# Patient Record
Sex: Female | Born: 1995 | Hispanic: Yes | Marital: Single | Smoking: Former smoker
Health system: Southern US, Community
[De-identification: ages and names within clinical notes are randomized; demographics above are authoritative.]

---
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
## 2016-06-05 ENCOUNTER — Emergency Department
Admission: EM | Admit: 2016-06-05 | Discharge: 2016-06-05 | Disposition: A | Discharge status: Home / Self Care | Payer: Medicaid Other | Attending: Emergency Medicine | Admitting: Emergency Medicine

## 2016-06-05 DIAGNOSIS — L739 Follicular disorder, unspecified: Secondary | ICD-10-CM | POA: Diagnosis not present

## 2016-06-05 DIAGNOSIS — L0231 Cutaneous abscess of buttock: Secondary | ICD-10-CM | POA: Diagnosis present

## 2016-06-05 DIAGNOSIS — L03317 Cellulitis of buttock: Secondary | ICD-10-CM

## 2016-06-05 MED ORDER — CLINDAMYCIN HCL 300 MG PO CAPS: 300.0000 mg | ORAL_CAPSULE | Freq: Three times a day (TID) | ORAL | 0 refills | Status: AC

## 2016-06-05 MED ORDER — LIDOCAINE HCL (PF) 1 % IJ SOLN
5.0000 mL | Freq: Once | INTRAMUSCULAR | Status: DC
  Filled 2016-06-05: qty 5

## 2016-06-05 NOTE — ED Triage Notes (Signed)
Pt c/o "bug bite" to left buttocks x3-4 days with increasing pain and erythema. Some purulent drainage.

## 2016-06-05 NOTE — ED Provider Notes (Signed)
[facility] Emergency Department Provider Note   ____________________________________________   I have reviewed the triage vital signs and the nursing notes.   HISTORY  Chief Complaint Abscess    HPI Sheri Brooks is a 21 y.o. female presents with abscess-like wound along the left buttock with redness and induration. Patient reports noticing it 3-4 days ago and attempting to express drainage from it on one occasion. She describes drainage is purulent. Patient thought it was a bug bite initially. Patient denies any past history of similar wound. Patient denies fever, chills, headache, vision changes, chest pain, chest tightness, shortness of breath, abdominal pain, nausea and vomiting.  No past medical history on file.  There are no active problems to display for this patient.   No past surgical history on file.  Prior to Admission medications   Medication Sig Start Date End Date Taking? Authorizing Provider  clindamycin (CLEOCIN) 300 MG capsule Take 1 capsule (300 mg total) by mouth 3 (three) times daily. 06/05/16 06/15/16  Brittny Spangle M, PA-C  phenazopyridine (PYRIDIUM) 100 MG tablet Take 1 tablet (100 mg total) by mouth 3 (three) times daily as needed for pain (May take 1-2 as needed three times daily). 06/09/15   Hagler, Jami L, PA-C  sulfamethoxazole-trimethoprim (BACTRIM DS,SEPTRA DS) 800-160 MG tablet Take 1 tablet by mouth 2 (two) times daily. 06/09/15   Hagler, Jami L, PA-C    Allergies Patient has no known allergies.  No family history on file.  Social History Social History  Substance Use Topics  . Smoking status: Never Smoker  . Smokeless tobacco: Never Used  . Alcohol use No    Review of Systems Constitutional: Negative for fever/chills Eyes: No visual changes. ENT:  Negative for sore throat and for difficulty swallowing Cardiovascular: Denies chest pain. Respiratory: Denies cough Denies shortness of breath. Musculoskeletal:  Negative for back pain. Negative for generalized body aches. Skin: Negative or rash. Abscess left buttock with surrounding erythema, non-draining.  Neurological: Negative for headaches.  Negative focal weakness or numbness.  ____________________________________________   PHYSICAL EXAM:  VITAL SIGNS: ED Triage Vitals  Enc Vitals Group     BP 06/05/16 1051 107/69     Pulse Rate 06/05/16 1051 83     Resp 06/05/16 1051 18     Temp 06/05/16 1051 98.3 F (36.8 C)     Temp Source 06/05/16 1051 Oral     SpO2 06/05/16 1051 99 %     Weight 06/05/16 1051 150 lb (68 kg)     Height 06/05/16 1051 5' (1.524 m)     Head Circumference --      Peak Flow --      Pain Score 06/05/16 1053 5     Pain Loc --      Pain Edu? --      Excl. in GC? --     Constitutional: Alert and oriented. Well appearing and in no acute distress.  Head: Normocephalic and atraumatic. Eyes: Conjunctivae are normal.  Cardiovascular: Normal rate, regular rhythm. Normal distal pulses. Respiratory: Normal respiratory effort.  Musculoskeletal: Nontender with normal range of motion in all extremities. Neurologic: Normal speech and language.  Skin:  Skin is warm, dry and intact. No rash noted. Left buttock abscess with induration and erythema encircling ~6" in diameter. Very warm to touch.  Psychiatric: Mood and affect are normal.  ____________________________________________   LABS (all labs ordered are listed, but only abnormal results are displayed)  Labs Reviewed - No data to display  ____________________________________________  EKG none ____________________________________________  RADIOLOGY none ____________________________________________   PROCEDURES  Procedure(s) performed:  INCISION AND DRAINAGE Performed by: Ramello Cordial M Victorious Cosio Consent: Verbal consent Clois Comberobtained. Risks and benefits: risks, benefits and alternatives were discussed Type: abscess  Body area: left buttock  Anesthesia: local  infiltration  Incision was made with a scalpel.  Local anesthetic: lidocaine 1%   Anesthetic total: 4.0 ml  Complexity: complex Blunt dissection to break up loculations  Drainage: purulent  Drainage amount: 5 ml  Packing material: 1/4 in iodoform gauze  Patient tolerance: Patient tolerated the procedure well with no immediate complications.   Critical Care performed: no ____________________________________________   INITIAL IMPRESSION / ASSESSMENT AND PLAN / ED COURSE  Pertinent labs & imaging results that were available during my care of the patient were reviewed by me and considered in my medical decision making (see chart for details).  Patient presents with left buttock abscess with induration and erythema surrounding approximate 6 inches in diameter. Patient tolerated I&D without complications. Patient will be prescribed clindamycin for antibiotic coverage and instructed to return in 2 days for wound check. Patient informed of clinical course, understand medical decision-making process, and agree with plan.       ____________________________________________   FINAL CLINICAL IMPRESSION(S) / ED DIAGNOSES  Final diagnoses:  Cellulitis of buttock  Folliculitis       NEW MEDICATIONS STARTED DURING THIS VISIT:  Discharge Medication List as of 06/05/2016  1:42 PM    START taking these medications   Details  clindamycin (CLEOCIN) 300 MG capsule Take 1 capsule (300 mg total) by mouth 3 (three) times daily., Starting Sun 06/05/2016, Until Wed 06/15/2016, Print         Note:  This document was prepared using Dragon voice recognition software and may include unintentional dictation errors.   Clois ComberLittle, Binh Doten M, PA-C 06/05/16 1726    Rockne MenghiniNorman, Anne-Caroline, MD 06/05/16 [phone]

## 2016-06-05 NOTE — ED Notes (Addendum)
Patient presents with wound site on left superior medial buttock. Reddened area is approx 6" in circumference around wound. Patient states that it was draining a thick brown fluid earlier in the shower. Site noted to be hot to the touch

## 2016-06-05 NOTE — Discharge Instructions (Signed)
Return for wound recheck to primary care provider or the emergency department in 2 days.

## 2016-06-07 ENCOUNTER — Emergency Department
Admission: EM | Admit: 2016-06-07 | Discharge: 2016-06-07 | Disposition: A | Discharge status: Home / Self Care | Payer: Medicaid Other | Attending: Emergency Medicine | Admitting: Emergency Medicine

## 2016-06-07 DIAGNOSIS — L0231 Cutaneous abscess of buttock: Secondary | ICD-10-CM | POA: Insufficient documentation

## 2016-06-07 DIAGNOSIS — Z5189 Encounter for other specified aftercare: Secondary | ICD-10-CM

## 2016-06-07 DIAGNOSIS — Z9114 Patient's other noncompliance with medication regimen: Secondary | ICD-10-CM | POA: Diagnosis not present

## 2016-06-07 DIAGNOSIS — Z48817 Encounter for surgical aftercare following surgery on the skin and subcutaneous tissue: Secondary | ICD-10-CM | POA: Insufficient documentation

## 2016-06-07 MED ORDER — SULFAMETHOXAZOLE-TRIMETHOPRIM 800-160 MG PO TABS: 1.0000 | ORAL_TABLET | Freq: Two times a day (BID) | ORAL | 0 refills | Status: AC

## 2016-06-07 NOTE — ED Notes (Signed)

## 2016-06-07 NOTE — ED Triage Notes (Signed)
Pt was seen here 2 days ago and had abscess drained and packed was told to come back today for follow up. Pt did not get antibiotics filled, states they were too expensive.

## 2016-06-07 NOTE — ED Provider Notes (Signed)
[facility] Emergency Department Provider Note  ____________________________________________  Time seen: Approximately 9:54 PM  I have reviewed the triage vital signs and the nursing notes.   HISTORY  Chief Complaint Wound Check    HPI Manasa Spease is a 21 y.o. female presenting to the emergency department for a wound check. Patient was seen on 06/05/2016 and underwent incision and drainage for an abscess of the left buttocks. Patient states that she has not changed her dressing since she underwent incision and drainage. Patient also did not fill her prescription because she states that it was too expensive at $30. Patient states that her pain has improved but abscess continues to drain. No other alleviating measures have been attempted.   No past medical history on file.  There are no active problems to display for this patient.   No past surgical history on file.  Prior to Admission medications   Medication Sig Start Date End Date Taking? Authorizing Provider  clindamycin (CLEOCIN) 300 MG capsule Take 1 capsule (300 mg total) by mouth 3 (three) times daily. 06/05/16 06/15/16  Little, Traci M, PA-C  phenazopyridine (PYRIDIUM) 100 MG tablet Take 1 tablet (100 mg total) by mouth 3 (three) times daily as needed for pain (May take 1-2 as needed three times daily). 06/09/15   Hagler, Jami L, PA-C  sulfamethoxazole-trimethoprim (BACTRIM DS) 800-160 MG tablet Take 1 tablet by mouth 2 (two) times daily. 06/07/16 06/17/16  Orvil Feil, PA-C    Allergies Patient has no known allergies.  No family history on file.  Social History Social History  Substance Use Topics  . Smoking status: Never Smoker  . Smokeless tobacco: Never Used  . Alcohol use No     Review of Systems  Constitutional: No fever/chills Eyes: No visual changes. No discharge ENT: No upper respiratory complaints. Cardiovascular: no chest pain. Respiratory: no cough. No  SOB. Gastrointestinal: No abdominal pain.  No nausea, no vomiting.  No diarrhea.  No constipation. Musculoskeletal: Negative for musculoskeletal pain. Skin: Patient has residual cellulitis and draining abscess of left buttocks. Neurological: Negative for headaches, focal weakness or numbness.   ____________________________________________   PHYSICAL EXAM:  VITAL SIGNS: ED Triage Vitals  Enc Vitals Group     BP 06/07/16 1959 107/71     Pulse Rate 06/07/16 1959 90     Resp 06/07/16 1959 20     Temp 06/07/16 1959 98.2 F (36.8 C)     Temp Source 06/07/16 1959 Oral     SpO2 06/07/16 1959 100 %     Weight 06/07/16 2000 150 lb (68 kg)     Height 06/07/16 2000 5' (1.524 m)     Head Circumference --      Peak Flow --      Pain Score 06/07/16 1959 6     Pain Loc --      Pain Edu? --      Excl. in GC? --      Constitutional: Alert and oriented. Well appearing and in no acute distress. Eyes: Conjunctivae are normal. PERRL. EOMI. Head: Atraumatic. Cardiovascular: Normal rate, regular rhythm. Normal S1 and S2.  Good peripheral circulation. Respiratory: Normal respiratory effort without tachypnea or retractions. Lungs CTAB. Good air entry to the bases with no decreased or absent breath sounds. Neurologic:  Normal speech and language. No gross focal neurologic deficits are appreciated.  Skin: Patient has residual cellulitis of the left buttocks. Patient has left buttocks abscess that continues to drain. No induration. No  fluctuance. Psychiatric: Mood and affect are normal. Speech and behavior are normal. Patient exhibits appropriate insight and judgement.   ____________________________________________   LABS (all labs ordered are listed, but only abnormal results are displayed)  Labs Reviewed - No data to display ____________________________________________  EKG   ____________________________________________  RADIOLOGY   No results  found.  ____________________________________________    PROCEDURES  Procedure(s) performed:    Procedures    Medications - No data to display   ____________________________________________   INITIAL IMPRESSION / ASSESSMENT AND PLAN / ED COURSE  Pertinent labs & imaging results that were available during my care of the patient were reviewed by me and considered in my medical decision making (see chart for details).  Review of the [place] CSRS was performed in accordance of the [employer] prior to dispensing any controlled drugs.     Assessment and plan: Wound check Patient presents to the emergency department with a draining abscess of the left buttocks. Patient states that she was unable to fill her prescription for clindamycin due to the expense. Patient was discharged with Bactrim. Patient was advised that Bactrim is on the $4 list. Basic wound care was given in the emergency department. Patient education was provided regarding appropriate wound care. Patient voiced understanding regarding these recommendations. Vital signs were reassuring prior to discharge. No packing needed to be removed from abscess. All patient questions were answered.    ____________________________________________  FINAL CLINICAL IMPRESSION(S) / ED DIAGNOSES  Final diagnoses:  Visit for wound check      NEW MEDICATIONS STARTED DURING THIS VISIT:  Discharge Medication List as of 06/07/2016  9:30 PM          This chart was dictated using voice recognition software/Dragon. Despite best efforts to proofread, errors can occur which can change the meaning. Any change was purely unintentional.    Orvil FeilWoods, Emnet Monk M, PA-C 06/07/16 2200    Myrna BlazerSchaevitz, David Matthew, MD 06/07/16 2330

## 2016-06-07 NOTE — ED Notes (Signed)
Pt states here for abscess recheck. Pt states on left buttock, was drained and packed, and seems to be bleeding more, denies other sx infection at this time.

## 2017-01-03 NOTE — L&D Delivery Note (Signed)
     Delivery Note   Sheri Brooks is a 22 y.o. [id number] at [redacted]w[redacted]d Estimated Date of Delivery: 11/10/17  PRE-OPERATIVE DIAGNOSIS:  1) [redacted]w[redacted]d pregnancy, Oligohydramnios    POST-OPERATIVE DIAGNOSIS:  1) [redacted]w[redacted]d pregnancy s/p Vaginal, Spontaneous    Delivery Type: Vaginal, Spontaneous    Delivery Anesthesia: Epidural   Labor Complications:    none   ESTIMATED BLOOD LOSS: 100  ml    FINDINGS:   1) female infant, Apgar scores of 8    at 1 minute and 9    at 5 minutes and a birthweight is pending. Infant remains skin to skin  2) Nuchal cord: yes, reduced. True knot in cord   SPECIMENS:   PLACENTA:   Appearance: Intact , 3 vessel cord. Cord blood sample collected   Removal: Spontaneous      Disposition:   Held per protocol then discarded   DISPOSITION:  Infant to left in stable condition in the delivery room, with L&D personnel and mother,  NARRATIVE SUMMARY: Labor course:  [name] is a [id number] at [redacted]w[redacted]d who presented for induction of labor for oligohydramnios..She progressed well in labor with pitocin.  She received the appropriate anesthesia and proceeded to complete dilation. She evidenced good maternal expulsive effort during the second stage. She went on to deliver a viable female infant. The placenta delivered without problems and was noted to be complete. A perineal and vaginal examination was performed. Episiotomy/Lacerations:   None. No sponges or sharps used.  The patient tolerated this well.  Doreene Burke, CNM  11/08/2017 4:56 PM

## 2017-05-15 ENCOUNTER — Other Ambulatory Visit (INDEPENDENT_AMBULATORY_CARE_PROVIDER_SITE_OTHER): Payer: Medicaid Other

## 2017-05-15 ENCOUNTER — Encounter: Payer: Self-pay | Admitting: Certified Nurse Midwife

## 2017-05-15 ENCOUNTER — Ambulatory Visit (INDEPENDENT_AMBULATORY_CARE_PROVIDER_SITE_OTHER): Payer: Medicaid Other | Admitting: Certified Nurse Midwife

## 2017-05-15 ENCOUNTER — Telehealth: Payer: Self-pay

## 2017-05-15 VITALS — BP 95/66 | HR 93 | Ht 60.0 in | Wt 152.6 lb

## 2017-05-15 DIAGNOSIS — Z124 Encounter for screening for malignant neoplasm of cervix: Secondary | ICD-10-CM

## 2017-05-15 DIAGNOSIS — Z3482 Encounter for supervision of other normal pregnancy, second trimester: Secondary | ICD-10-CM

## 2017-05-15 LAB — POCT URINALYSIS DIPSTICK
Bilirubin, UA: NEGATIVE
GLUCOSE UA: NEGATIVE
LEUKOCYTES UA: NEGATIVE
Nitrite, UA: NEGATIVE
Protein, UA: NEGATIVE
RBC UA: NEGATIVE
Spec Grav, UA: 1.025 (ref 1.010–1.025)
Urobilinogen, UA: 0.2 E.U./dL
pH, UA: 5 (ref 5.0–8.0)

## 2017-05-15 NOTE — Patient Instructions (Signed)
Round Ligament Pain The round ligament is a cord of muscle and tissue that helps to support the uterus. It can become a source of pain during pregnancy if it becomes stretched or twisted as the baby grows. The pain usually begins in the second trimester of pregnancy, and it can come and go until the baby is delivered. It is not a serious problem, and it does not cause harm to the baby. Round ligament pain is usually a short, sharp, and pinching pain, but it can also be a dull, lingering, and aching pain. The pain is felt in the lower side of the abdomen or in the groin. It usually starts deep in the groin and moves up to the outside of the hip area. Pain can occur with:  A sudden change in position.  Rolling over in bed.  Coughing or sneezing.  Physical activity.  Follow these instructions at home: Watch your condition for any changes. Take these steps to help with your pain:  When the pain starts, relax. Then try: ? Sitting down. ? Flexing your knees up to your abdomen. ? Lying on your side with one pillow under your abdomen and another pillow between your legs. ? Sitting in a warm bath for 15-20 minutes or until the pain goes away.  Take over-the-counter and prescription medicines only as told by your health care provider.  Move slowly when you sit and stand.  Avoid long walks if they cause pain.  Stop or lessen your physical activities if they cause pain.  Contact a health care provider if:  Your pain does not go away with treatment.  You feel pain in your back that you did not have before.  Your medicine is not helping. Get help right away if:  You develop a fever or chills.  You develop uterine contractions.  You develop vaginal bleeding.  You develop nausea or vomiting.  You develop diarrhea.  You have pain when you urinate. This information is not intended to replace advice given to you by your health care provider. Make sure you discuss any questions you have  with your health care provider. Document Released: 09/29/2007 Document Revised: 05/28/2015 Document Reviewed: 02/26/2014 Elsevier Interactive Patient Education  2018 [employer]. Back Pain in Pregnancy Back pain during pregnancy is common. Back pain may be caused by several factors that are related to changes during your pregnancy. Follow these instructions at home: Managing pain, stiffness, and swelling  If directed, apply ice for sudden (acute) back pain. ? Put ice in a plastic bag. ? Place a towel between your skin and the bag. ? Leave the ice on for 20 minutes, 2-3 times per day.  If directed, apply heat to the affected area before you exercise: ? Place a towel between your skin and the heat pack or heating pad. ? Leave the heat on for 20-30 minutes. ? Remove the heat if your skin turns bright red. This is especially important if you are unable to feel pain, heat, or cold. You may have a greater risk of getting burned. Activity  Exercise as told by your health care provider. Exercising is the best way to prevent or manage back pain.  Listen to your body when lifting. If lifting hurts, ask for help or bend your knees. This uses your leg muscles instead of your back muscles.  Squat down when picking up something from the floor. Do not bend over.  Only use bed rest as told by your health care provider. Bed  rest should only be used for the most severe episodes of back pain. Standing, Sitting, and Lying Down  Do not stand in one place for long periods of time.  Use good posture when sitting. Make sure your head rests over your shoulders and is not hanging forward. Use a pillow on your lower back if necessary.  Try sleeping on your side, preferably the left side, with a pillow or two between your legs. If you are sore after a night's rest, your bed may be too soft. A firm mattress may provide more support for your back during pregnancy. General instructions  Do not wear high  heels.  Eat a healthy diet. Try to gain weight within your health care provider's recommendations.  Use a maternity girdle, elastic sling, or back brace as told by your health care provider.  Take over-the-counter and prescription medicines only as told by your health care provider.  Keep all follow-up visits as told by your health care provider. This is important. This includes any visits with any specialists, such as a physical therapist. Contact a health care provider if:  Your back pain interferes with your daily activities.  You have increasing pain in other parts of your body. Get help right away if:  You develop numbness, tingling, weakness, or problems with the use of your arms or legs.  You develop severe back pain that is not controlled with medicine.  You have a sudden change in bowel or bladder control.  You develop shortness of breath, dizziness, or you faint.  You develop nausea, vomiting, or sweating.  You have back pain that is a rhythmic, cramping pain similar to labor pains. Labor pain is usually 1-2 minutes apart, lasts for about 1 minute, and involves a bearing down feeling or pressure in your pelvis.  You have back pain and your water breaks or you have vaginal bleeding.  You have back pain or numbness that travels down your leg.  Your back pain developed after you fell.  You develop pain on one side of your back.  You see blood in your urine.  You develop skin blisters in the area of your back pain. This information is not intended to replace advice given to you by your health care provider. Make sure you discuss any questions you have with your health care provider. Document Released: 03/30/2005 Document Revised: 05/28/2015 Document Reviewed: 09/03/2014 Elsevier Interactive Patient Education  2018 [place] of Pregnancy The second trimester is from week 13 through week 28, month 4 through 6. This is often the time in pregnancy  that you feel your best. Often times, morning sickness has lessened or quit. You may have more energy, and you may get hungry more often. Your unborn baby (fetus) is growing rapidly. At the end of the sixth month, he or she is about 9 inches long and weighs about 1 pounds. You will likely feel the baby move (quickening) between 18 and 20 weeks of pregnancy. Follow these instructions at home:  Avoid all smoking, herbs, and alcohol. Avoid drugs not approved by your doctor.  Do not use any tobacco products, including cigarettes, chewing tobacco, and electronic cigarettes. If you need help quitting, ask your doctor. You may get counseling or other support to help you quit.  Only take medicine as told by your doctor. Some medicines are safe and some are not during pregnancy.  Exercise only as told by your doctor. Stop exercising if you start having cramps.  Eat regular,  healthy meals.  Wear a good support bra if your breasts are tender.  Do not use hot tubs, steam rooms, or saunas.  Wear your seat belt when driving.  Avoid raw meat, uncooked cheese, and liter boxes and soil used by cats.  Take your prenatal vitamins.  Take 1500-2000 milligrams of calcium daily starting at the 20th week of pregnancy until you deliver your baby.  Try taking medicine that helps you poop (stool softener) as needed, and if your doctor approves. Eat more fiber by eating fresh fruit, vegetables, and whole grains. Drink enough fluids to keep your pee (urine) clear or pale yellow.  Take warm water baths (sitz baths) to soothe pain or discomfort caused by hemorrhoids. Use hemorrhoid cream if your doctor approves.  If you have puffy, bulging veins (varicose veins), wear support hose. Raise (elevate) your feet for 15 minutes, 3-4 times a day. Limit salt in your diet.  Avoid heavy lifting, wear low heals, and sit up straight.  Rest with your legs raised if you have leg cramps or low back pain.  Visit your dentist  if you have not gone during your pregnancy. Use a soft toothbrush to brush your teeth. Be gentle when you floss.  You can have sex (intercourse) unless your doctor tells you not to.  Go to your doctor visits. Get help if:  You feel dizzy.  You have mild cramps or pressure in your lower belly (abdomen).  You have a nagging pain in your belly area.  You continue to feel sick to your stomach (nauseous), throw up (vomit), or have watery poop (diarrhea).  You have bad smelling fluid coming from your vagina.  You have pain with peeing (urination). Get help right away if:  You have a fever.  You are leaking fluid from your vagina.  You have spotting or bleeding from your vagina.  You have severe belly cramping or pain.  You lose or gain weight rapidly.  You have trouble catching your breath and have chest pain.  You notice sudden or extreme puffiness (swelling) of your face, hands, ankles, feet, or legs.  You have not felt the baby move in over an hour.  You have severe headaches that do not go away with medicine.  You have vision changes. This information is not intended to replace advice given to you by your health care provider. Make sure you discuss any questions you have with your health care provider. Document Released: 03/16/2009 Document Revised: 05/28/2015 Document Reviewed: 02/21/2012 Elsevier Interactive Patient Education  2017 [place] for Pregnant Women While you are pregnant, your body will require additional nutrition to help support your growing baby. It is recommended that you consume:  150 additional calories each day during your first trimester.  300 additional calories each day during your second trimester.  300 additional calories each day during your third trimester.  Eating a healthy, well-balanced diet is very important for your health and for your baby's health. You also have a higher need for some vitamins and minerals, such as  folic acid, calcium, iron, and vitamin D. What do I need to know about eating during pregnancy?  Do not try to lose weight or go on a diet during pregnancy.  Choose healthy, nutritious foods. Choose  of a sandwich with a glass of milk instead of a candy bar or a high-calorie sugar-sweetened beverage.  Limit your overall intake of foods that have "empty calories." These are foods that have little nutritional value,  such as sweets, desserts, candies, sugar-sweetened beverages, and fried foods.  Eat a variety of foods, especially fruits and vegetables.  Take a prenatal vitamin to help meet the additional needs during pregnancy, specifically for folic acid, iron, calcium, and vitamin D.  Remember to stay active. Ask your health care provider for exercise recommendations that are specific to you.  Practice good food safety and cleanliness, such as washing your hands before you eat and after you prepare raw meat. This helps to prevent foodborne illnesses, such as listeriosis, that can be very dangerous for your baby. Ask your health care provider for more information about listeriosis. What does 150 extra calories look like? Healthy options for an additional 150 calories each day could be any of the following:  Plain low-fat yogurt (6-8 oz) with  cup of berries.  1 apple with 2 teaspoons of peanut butter.  Cut-up vegetables with  cup of hummus.  Low-fat chocolate milk (8 oz or 1 cup).  1 string cheese with 1 medium orange.   of a peanut butter and jelly sandwich on whole-wheat bread (1 tsp of peanut butter).  For 300 calories, you could eat two of those healthy options each day. What is a healthy amount of weight to gain? The recommended amount of weight for you to gain is based on your pre-pregnancy BMI. If your pre-pregnancy BMI was:  Less than 18 (underweight), you should gain 28-40 lb.  18-24.9 (normal), you should gain 25-35 lb.  25-29.9 (overweight), you should gain 15-25  lb.  Greater than 30 (obese), you should gain 11-20 lb.  What if I am having twins or multiples? Generally, pregnant women who will be having twins or multiples may need to increase their daily calories by 300-600 calories each day. The recommended range for total weight gain is 25-54 lb, depending on your pre-pregnancy BMI. Talk with your health care provider for specific guidance about additional nutritional needs, weight gain, and exercise during your pregnancy. What foods can I eat? Grains Any grains. Try to choose whole grains, such as whole-wheat bread, oatmeal, or brown rice. Vegetables Any vegetables. Try to eat a variety of colors and types of vegetables to get a full range of vitamins and minerals. Remember to wash your vegetables well before eating. Fruits Any fruits. Try to eat a variety of colors and types of fruit to get a full range of vitamins and minerals. Remember to wash your fruits well before eating. Meats and Other Protein Sources Lean meats, including chicken, [place], fish, and lean cuts of beef, veal, or pork. Make sure that all meats are cooked to "well done." Tofu. Tempeh. Beans. Eggs. Peanut butter and other nut butters. Seafood, such as shrimp, crab, and lobster. If you choose fish, select types that are higher in omega-3 fatty acids, including salmon, herring, mussels, trout, sardines, and pollock. Make sure that all meats are cooked to food-safe temperatures. Dairy Pasteurized milk and milk alternatives. Pasteurized yogurt and pasteurized cheese. Cottage cheese. Sour cream. Beverages Water. Juices that contain 100% fruit juice or vegetable juice. Caffeine-free teas and decaffeinated coffee. Drinks that contain caffeine are okay to drink, but it is better to avoid caffeine. Keep your total caffeine intake to less than 200 mg each day (12 oz of coffee, tea, or soda) or as directed by your health care provider. Condiments Any pasteurized condiments. Sweets and  Desserts Any sweets and desserts. Fats and Oils Any fats and oils. The items listed above may not be a complete list  of recommended foods or beverages. Contact your dietitian for more options. What foods are not recommended? Vegetables Unpasteurized (raw) vegetable juices. Fruits Unpasteurized (raw) fruit juices. Meats and Other Protein Sources Cured meats that have nitrates, such as bacon, salami, and hotdogs. Luncheon meats, bologna, or other deli meats (unless they are reheated until they are steaming hot). Refrigerated pate, meat spreads from a meat counter, smoked seafood that is found in the refrigerated section of a store. Raw fish, such as sushi or sashimi. High mercury content fish, such as tilefish, shark, swordfish, and king mackerel. Raw meats, such as tuna or beef tartare. Undercooked meats and poultry. Make sure that all meats are cooked to food-safe temperatures. Dairy Unpasteurized (raw) milk and any foods that have raw milk in them. Soft cheeses, such as feta, queso blanco, queso fresco, Brie, Camembert cheeses, blue-veined cheeses, and Panela cheese (unless it is made with pasteurized milk, which must be stated on the label). Beverages Alcohol. Sugar-sweetened beverages, such as sodas, teas, or energy drinks. Condiments Homemade fermented foods and drinks, such as pickles, sauerkraut, or kombucha drinks. (Store-bought pasteurized versions of these are okay.) Other Salads that are made in the store, such as ham salad, chicken salad, egg salad, tuna salad, and seafood salad. The items listed above may not be a complete list of foods and beverages to avoid. Contact your dietitian for more information. This information is not intended to replace advice given to you by your health care provider. Make sure you discuss any questions you have with your health care provider. Document Released: 10/04/2013 Document Revised: 05/28/2015 Document Reviewed: 06/04/2013 Elsevier Interactive  Patient Education  2018 [employer] WHAT OB PATIENTS CAN EXPECT   Confirmation of pregnancy and ultrasound ordered if medically indicated-[redacted] weeks gestation  New OB (NOB) intake with nurse and New OB (NOB) labs- [redacted] weeks gestation  New OB (NOB) physical examination with provider- 11/[redacted] weeks gestation  Flu vaccine-[redacted] weeks gestation  Anatomy scan-[redacted] weeks gestation  Glucose tolerance test, blood work to test for anemia, T-dap vaccine-[redacted] weeks gestation  Vaginal swabs/cultures-STD/Group B strep-[redacted] weeks gestation  Appointments every 4 weeks until 28 weeks  Every 2 weeks from 28 weeks until 36 weeks  Weekly visits from 36 weeks until delivery  Common Medications Safe in Pregnancy  Acne:      Constipation:  Benzoyl Peroxide     Colace  Clindamycin      Dulcolax Suppository  [place] Erythromycin     Fibercon  Salicylic Acid      Metamucil         Miralax AVOID:        Senakot   Accutane    Cough:  Retin-A       Cough Drops  Tetracycline      Phenergan w/ Codeine if Rx  Minocycline      Robitussin (Plain & DM)  Antibiotics:     Crabs/Lice:  Ceclor       RID  Cephalosporins    AVOID:  E-Mycins      Kwell  Keflex  Macrobid/Macrodantin   Diarrhea:  Penicillin      Kao-Pectate  Zithromax      Imodium AD         PUSH FLUIDS AVOID:       Cipro     Fever:  Tetracycline      Tylenol (Regular or Extra  Minocycline       Strength)  Levaquin      Extra Strength-Do not  Exceed 8 tabs/24 hrs Caffeine:        <238m/day (equiv. To 1 cup of coffee or  approx. 3 12 oz sodas)         Gas: Cold/Hayfever:       Gas-X  Benadryl      Mylicon  Claritin       Phazyme  **Claritin-D        Chlor-Trimeton    Headaches:  Dimetapp      ASA-Free Excedrin  Drixoral-Non-Drowsy     Cold Compress  Mucinex (Guaifenasin)     Tylenol (Regular or Extra  Sudafed/Sudafed-12 Hour     Strength)  **Sudafed PE Pseudoephedrine   Tylenol Cold & Sinus     Vicks Vapor Rub  Zyrtec  **AVOID  if Problems With Blood Pressure         Heartburn: Avoid lying down for at least 1 hour after meals  Aciphex      Maalox     Rash:  Milk of Magnesia     Benadryl    Mylanta       1% Hydrocortisone Cream  Pepcid  Pepcid Complete   Sleep Aids:  Prevacid      Ambien   Prilosec       Benadryl  Rolaids       Chamomile Tea  Tums (Limit 4/day)     Unisom  Zantac       Tylenol PM         Warm milk-add vanilla or  Hemorrhoids:       Sugar for taste  Anusol/Anusol H.C.  (RX: Analapram 2.5%)  Sugar Substitutes:  Hydrocortisone OTC     Ok in moderation  Preparation H      Tucks        Vaseline lotion applied to tissue with wiping    Herpes:     Throat:  Acyclovir      Oragel  Famvir  Valtrex     Vaccines:         Flu Shot Leg Cramps:       *Gardasil  Benadryl      Hepatitis A         Hepatitis B Nasal Spray:       Pneumovax  Saline Nasal Spray     Polio Booster         Tetanus Nausea:       Tuberculosis test or PPD  Vitamin B6 25 mg TID   AVOID:    Dramamine      *Gardasil  Emetrol       Live Poliovirus  Ginger Root 250 mg QID    MMR (measles, mumps &  High Complex Carbs @ Bedtime    rebella)  Sea Bands-Accupressure    Varicella (Chickenpox)  Unisom 1/2 tab TID     *No known complications           If received before Pain:         Known pregnancy;   Darvocet       Resume series after  Lortab        Delivery  Percocet    Yeast:   Tramadol      Femstat  Tylenol 3      Gyne-lotrimin  Ultram       Monistat  Vicodin           MISC:         All Sunscreens  Hair Coloring/highlights          Insect Repellant's          (Including DEET)         Mystic Tans  

## 2017-05-15 NOTE — Telephone Encounter (Signed)
Attempted to contact pt - voicemail states customer is unavailable try again later. Pt was supposed to get labs drawn before she left.

## 2017-05-15 NOTE — Progress Notes (Signed)
NEW OB HISTORY AND PHYSICAL  SUBJECTIVE:       Sheri Brooks is a 22 y.o. G49P1001 female, Patient's last menstrual period was 01/25/2017 (approximate)., Estimated Date of Delivery: 11/10/17, [redacted]w[redacted]d, presents today for establishment of Prenatal Care.  Endorses breast tenderness and nausea that have resolved. Pregnancy was confirmed at [facility].   Denies difficulty breathing or respiratory distress, chest pain, abdominal pain, vaginal bleeding, dysuria, and leg pain or swelling.    Gynecologic History  Patient's last menstrual period was 01/25/2017 (approximate).   Contraception: none  Last Pap: due.   Obstetric History  OB History  Gravida Para Term Preterm AB Living  SAB TAB Ectopic Multiple Live Births          1    # Outcome Date GA Lbr Len/2nd Weight Sex Delivery Anes PTL Lv  2 Current           1 Term 43    M Vag-Spont  N LIV    No past medical history on file.  No past surgical history on file.  Current Outpatient Medications on File Prior to Visit  Medication Sig Dispense Refill  . Prenatal Vit-Fe Fumarate-FA (PRENATAL MULTIVITAMIN) TABS tablet Take 1 tablet by mouth daily at 12 noon.    . phenazopyridine (PYRIDIUM) 100 MG tablet Take 1 tablet (100 mg total) by mouth 3 (three) times daily as needed for pain (May take 1-2 as needed three times daily). (Patient not taking: Reported on 05/15/2017) 9 tablet 0   No current facility-administered medications on file prior to visit.     No Known Allergies  Social History   Socioeconomic History  . Marital status: Single    Spouse name: Not on file  . Number of children: Not on file  . Years of education: Not on file  . Highest education level: Not on file  Occupational History  . Not on file  Social Needs  . Financial resource strain: Not on file  . Food insecurity:    Worry: Not on file    Inability: Not on file  . Transportation needs:    Medical: Not on file    Non-medical: Not on file   Tobacco Use  . Smoking status: Never Smoker  . Smokeless tobacco: Never Used  Substance and Sexual Activity  . Alcohol use: No  . Drug use: No  . Sexual activity: Not on file  Lifestyle  . Physical activity:    Days per week: Not on file    Minutes per session: Not on file  . Stress: Not on file  Relationships  . Social connections:    Talks on phone: Not on file    Gets together: Not on file    Attends religious service: Not on file    Active member of club or organization: Not on file    Attends meetings of clubs or organizations: Not on file    Relationship status: Not on file  . Intimate partner violence:    Fear of current or ex partner: Not on file    Emotionally abused: Not on file    Physically abused: Not on file    Forced sexual activity: Not on file  Other Topics Concern  . Not on file  Social History Narrative  . Not on file    No family history on file.  The following portions of the patient's history were reviewed and updated as appropriate: allergies, current medications, past  OB history, past medical history, past surgical history, past family history, past social history, and problem list.  OBJECTIVE:   BP 95/66   Pulse 93   Wt 152 lb 9 oz (69.2 kg)   LMP 01/25/2017 (Approximate)   BMI 29.80 kg/m   Initial Physical Exam (New OB)  GENERAL APPEARANCE: alert, well appearing, in no apparent distress  HEAD: normocephalic, atraumatic  MOUTH: mucous membranes moist, pharynx normal without lesions  THYROID: no thyromegaly or masses present  BREASTS: not examined  LUNGS: clear to auscultation, no wheezes, rales or rhonchi, symmetric air entry  HEART: regular rate and rhythm, no murmurs  ABDOMEN: soft, nontender, nondistended, no abnormal masses, no epigastric pain and FHT present  EXTREMITIES: no redness or tenderness in the calves or thighs, no edema  SKIN: normal coloration and turgor, no rashes  LYMPH NODES: no adenopathy  palpable  NEUROLOGIC: alert, oriented, normal speech, no focal findings or movement disorder noted  PELVIC EXAM EXTERNAL GENITALIA: normal appearing vulva with no masses, tenderness or lesions VAGINA: no abnormal discharge or lesions CERVIX: no lesions or cervical motion tenderness  ULTRASOUND REPORT  Location: [facility] Date of Service:  05/15/2017  Indications: Dating Findings:  Singleton intrauterine pregnancy is visualized with FHR at 150 BPM. Biometrics give an (U/S) Gestational age of 84 3/7 weeks and an (U/S) EDD of 11/10/17; this DOES NOT correlate with the clinically established EDD of 11/01/17.  AFI: WNL subjectively.  Early anatomy appears WNL.  Right Ovary measures 2.3 x 1.8 x 2.0 cm. It is normal in appearance. Left Ovary measures 2.4 x 2.1 x 1.5 cm. It is normal appearance. There is no obvious evidence of a corpus luteal cyst. Survey of the adnexa demonstrates no adnexal masses. There is no free peritoneal fluid in the cul de sac.  Impression: 1. 14 3/7 week Viable Singleton Intrauterine pregnancy by U/S. 2. (U/S) EDD IS NOT consistent with Clinically established (LMP) EDD of 11/01/17.   Recommendations: 1.Clinical correlation with the patient's History and Physical Exam. 2. EDD should be adjusted to match today's ultrasound of 11/10/17.  ASSESSMENT: Normal pregnancy Late entry prenatal care Declines genetic screening   PLAN: Prenatal care Sample PNV given New OB counseling: The patient has been given an overview regarding routine prenatal care. Recommendations regarding diet, weight gain, and exercise in pregnancy were given. Prenatal testing, optional genetic testing, and ultrasound use in pregnancy were reviewed.  Benefits of Breast Feeding were discussed. The patient is encouraged to consider nursing her baby post partum. See orders   Gunnar Bulla, CNM [facility], [facility]

## 2017-05-15 NOTE — Progress Notes (Signed)
New pt is here for a prenatal visit. Confirmation done 05/10/17 at [facility]. She has not had any blood work or U/S. Denies N/V.Refused genetic testing.

## 2017-05-16 LAB — PAP IG, RFX HPV ASCU,16/18: PAP SMEAR COMMENT: 0

## 2017-05-23 ENCOUNTER — Telehealth: Payer: Self-pay

## 2017-05-23 NOTE — Progress Notes (Signed)
Please contact patient. Pap normal. Still needs New OB labs. Encourage MyChart activation. Thanks, JML

## 2017-05-23 NOTE — Telephone Encounter (Signed)
Attempted to contact pt- recording states the person you are trying to reach is not accepting calls at this time.

## 2017-06-01 ENCOUNTER — Telehealth: Payer: Self-pay

## 2017-06-01 NOTE — Telephone Encounter (Signed)
Attempted to contact pt- message states the person you are trying to contact is not taking calls at this time.

## 2017-06-12 ENCOUNTER — Ambulatory Visit (INDEPENDENT_AMBULATORY_CARE_PROVIDER_SITE_OTHER): Payer: Medicaid Other | Admitting: Certified Nurse Midwife

## 2017-06-12 ENCOUNTER — Other Ambulatory Visit: Payer: Medicaid Other

## 2017-06-12 VITALS — BP 85/56 | HR 88 | Wt 157.2 lb

## 2017-06-12 DIAGNOSIS — Z3A20 20 weeks gestation of pregnancy: Secondary | ICD-10-CM

## 2017-06-12 DIAGNOSIS — Z3482 Encounter for supervision of other normal pregnancy, second trimester: Secondary | ICD-10-CM

## 2017-06-12 LAB — POCT URINALYSIS DIPSTICK
Bilirubin, UA: NEGATIVE
Blood, UA: NEGATIVE
GLUCOSE UA: NEGATIVE
KETONES UA: NEGATIVE
Leukocytes, UA: NEGATIVE
Nitrite, UA: NEGATIVE
Protein, UA: NEGATIVE
SPEC GRAV UA: 1.01 (ref 1.010–1.025)
Urobilinogen, UA: 0.2 E.U./dL
pH, UA: 7.5 (ref 5.0–8.0)

## 2017-06-12 MED ORDER — VITAFOL ULTRA 29-0.6-0.4-200 MG PO CAPS: 1.0000 | ORAL_CAPSULE | Freq: Every day | ORAL | 6 refills | Status: DC

## 2017-06-12 NOTE — Patient Instructions (Signed)

## 2017-06-12 NOTE — Progress Notes (Signed)
ROB, doing well. No complaints. Feels fetal movement.  NOB labs collected today. Discussed return in 2 wks for anatomy u/s. Pt verbalizes understanding and agrees to plan of care. Follow up 2 wks.   Doreene BurkeAnnie Venetta Knee, CNM

## 2017-06-12 NOTE — Addendum Note (Signed)
Addended by: Brooke DareSICK, Chakira Jachim L on: 06/12/2017 11:42 AM   Modules accepted: Orders

## 2017-06-12 NOTE — Progress Notes (Signed)
Pt is here for an ROB visit. 

## 2017-06-12 NOTE — Addendum Note (Signed)
Addended by: Brooke DareSICK, Milta Croson L on: 06/12/2017 10:43 AM   Modules accepted: Orders

## 2017-06-13 LAB — CBC WITH DIFFERENTIAL
Basophils Absolute: 0 10*3/uL (ref 0.0–0.2)
Basos: 0 %
EOS (ABSOLUTE): 0.1 10*3/uL (ref 0.0–0.4)
Eos: 1 %
HEMATOCRIT: 35.1 % (ref 34.0–46.6)
HEMOGLOBIN: 11.4 g/dL (ref 11.1–15.9)
IMMATURE GRANS (ABS): 0.1 10*3/uL (ref 0.0–0.1)
Immature Granulocytes: 1 %
LYMPHS ABS: 1.5 10*3/uL (ref 0.7–3.1)
LYMPHS: 23 %
MCH: 30.4 pg (ref 26.6–33.0)
MCHC: 32.5 g/dL (ref 31.5–35.7)
MCV: 94 fL (ref 79–97)
MONOCYTES: 7 %
Monocytes Absolute: 0.5 10*3/uL (ref 0.1–0.9)
NEUTROS ABS: 4.5 10*3/uL (ref 1.4–7.0)
Neutrophils: 68 %
RBC: 3.75 x10E6/uL — ABNORMAL LOW (ref 3.77–5.28)
RDW: 13.7 % (ref 12.3–15.4)
WBC: 6.7 10*3/uL (ref 3.4–10.8)

## 2017-06-13 LAB — MONITOR DRUG PROFILE 14(MW)
Amphetamine Scrn, Ur: NEGATIVE ng/mL
BARBITURATE SCREEN URINE: NEGATIVE ng/mL
BENZODIAZEPINE SCREEN, URINE: NEGATIVE ng/mL
BUPRENORPHINE, URINE: NEGATIVE ng/mL
CANNABINOIDS UR QL SCN: NEGATIVE ng/mL
COCAINE(METAB.)SCREEN, URINE: NEGATIVE ng/mL
CREATININE(CRT), U: 95.8 mg/dL (ref 20.0–300.0)
Fentanyl, Urine: NEGATIVE pg/mL
METHADONE SCREEN, URINE: NEGATIVE ng/mL
Meperidine Screen, Urine: NEGATIVE ng/mL
OXYCODONE+OXYMORPHONE UR QL SCN: NEGATIVE ng/mL
Opiate Scrn, Ur: NEGATIVE ng/mL
PHENCYCLIDINE QUANTITATIVE URINE: NEGATIVE ng/mL
Ph of Urine: 6.1 (ref 4.5–8.9)
Propoxyphene Scrn, Ur: NEGATIVE ng/mL
SPECIFIC GRAVITY: 1.007
Tramadol Screen, Urine: NEGATIVE ng/mL

## 2017-06-13 LAB — MICROSCOPIC EXAMINATION: Casts: NONE SEEN /lpf

## 2017-06-13 LAB — URINALYSIS, ROUTINE W REFLEX MICROSCOPIC
BILIRUBIN UA: NEGATIVE
Glucose, UA: NEGATIVE
Ketones, UA: NEGATIVE
NITRITE UA: NEGATIVE
PH UA: 6.5 (ref 5.0–7.5)
Protein, UA: NEGATIVE
RBC UA: NEGATIVE
Specific Gravity, UA: 1.01 (ref 1.005–1.030)
Urobilinogen, Ur: 1 mg/dL (ref 0.2–1.0)

## 2017-06-13 LAB — RUBELLA SCREEN: Rubella Antibodies, IGG: 1.57 index (ref >0.99)

## 2017-06-13 LAB — NICOTINE SCREEN, URINE: Cotinine Ql Scrn, Ur: NEGATIVE ng/mL

## 2017-06-13 LAB — ABO AND RH: RH TYPE: POSITIVE

## 2017-06-13 LAB — ANTIBODY SCREEN: Antibody Screen: NEGATIVE

## 2017-06-13 LAB — SICKLE CELL SCREEN: Sickle Cell Screen: NEGATIVE

## 2017-06-13 LAB — GC/CHLAMYDIA PROBE AMP
Chlamydia trachomatis, NAA: NEGATIVE
Neisseria gonorrhoeae by PCR: NEGATIVE

## 2017-06-13 LAB — VARICELLA ZOSTER ANTIBODY, IGG: VARICELLA: 951 {index} (ref >165)

## 2017-06-13 LAB — HIV ANTIBODY (ROUTINE TESTING W REFLEX): HIV Screen 4th Generation wRfx: NONREACTIVE

## 2017-06-13 LAB — HEPATITIS B SURFACE ANTIGEN: Hepatitis B Surface Ag: NEGATIVE

## 2017-06-13 LAB — RPR: RPR: NONREACTIVE

## 2017-06-15 LAB — URINE CULTURE

## 2017-06-26 ENCOUNTER — Encounter: Payer: Self-pay | Admitting: Certified Nurse Midwife

## 2017-06-26 ENCOUNTER — Ambulatory Visit (INDEPENDENT_AMBULATORY_CARE_PROVIDER_SITE_OTHER): Payer: Medicaid Other | Admitting: Certified Nurse Midwife

## 2017-06-26 ENCOUNTER — Ambulatory Visit (INDEPENDENT_AMBULATORY_CARE_PROVIDER_SITE_OTHER): Payer: Medicaid Other

## 2017-06-26 VITALS — BP 90/56 | HR 93 | Wt 162.2 lb

## 2017-06-26 DIAGNOSIS — Z363 Encounter for antenatal screening for malformations: Secondary | ICD-10-CM

## 2017-06-26 DIAGNOSIS — Z3A20 20 weeks gestation of pregnancy: Secondary | ICD-10-CM

## 2017-06-26 LAB — POCT URINALYSIS DIPSTICK
Bilirubin, UA: NEGATIVE
GLUCOSE UA: NEGATIVE
Ketones, UA: NEGATIVE
Nitrite, UA: NEGATIVE
Odor: NEGATIVE
PH UA: 7 (ref 5.0–8.0)
Protein, UA: NEGATIVE
RBC UA: NEGATIVE
Spec Grav, UA: 1.015 (ref 1.010–1.025)
Urobilinogen, UA: 0.2 E.U./dL

## 2017-06-26 MED ORDER — VITAFOL ULTRA 29-0.6-0.4-200 MG PO CAPS: 1.0000 | ORAL_CAPSULE | Freq: Every day | ORAL | 11 refills | Status: DC

## 2017-06-26 NOTE — Progress Notes (Signed)
ROB and anatomy. NO complaints.  

## 2017-06-26 NOTE — Patient Instructions (Signed)
Abdominal Pain During Pregnancy Belly (abdominal) pain is common during pregnancy. Most of the time, it is not a serious problem. Other times, it can be a sign that something is wrong with the pregnancy. Always tell your doctor if you have belly pain. Follow these instructions at home: Monitor your belly pain for any changes. The following actions may help you feel better:  Do not have sex (intercourse) or put anything in your vagina until you feel better.  Rest until your pain stops.  Drink clear fluids if you feel sick to your stomach (nauseous). Do not eat solid food until you feel better.  Only take medicine as told by your doctor.  Keep all doctor visits as told.  Get help right away if:  You are bleeding, leaking fluid, or pieces of tissue come out of your vagina.  You have more pain or cramping.  You keep throwing up (vomiting).  You have pain when you pee (urinate) or have blood in your pee.  You have a fever.  You do not feel your baby moving as much.  You feel very weak or feel like passing out.  You have trouble breathing, with or without belly pain.  You have a very bad headache and belly pain.  You have fluid leaking from your vagina and belly pain.  You keep having watery poop (diarrhea).  Your belly pain does not go away after resting, or the pain gets worse. This information is not intended to replace advice given to you by your health care provider. Make sure you discuss any questions you have with your health care provider. Document Released: 12/08/2008 Document Revised: 07/29/2015 Document Reviewed: 07/19/2012 Elsevier Interactive Patient Education  2018 [employer]. Back Pain in Pregnancy Back pain during pregnancy is common. Back pain may be caused by several factors that are related to changes during your pregnancy. Follow these instructions at home: Managing pain, stiffness, and swelling  If directed, apply ice for sudden (acute) back  pain. ? Put ice in a plastic bag. ? Place a towel between your skin and the bag. ? Leave the ice on for 20 minutes, 2-3 times per day.  If directed, apply heat to the affected area before you exercise: ? Place a towel between your skin and the heat pack or heating pad. ? Leave the heat on for 20-30 minutes. ? Remove the heat if your skin turns bright red. This is especially important if you are unable to feel pain, heat, or cold. You may have a greater risk of getting burned. Activity  Exercise as told by your health care provider. Exercising is the best way to prevent or manage back pain.  Listen to your body when lifting. If lifting hurts, ask for help or bend your knees. This uses your leg muscles instead of your back muscles.  Squat down when picking up something from the floor. Do not bend over.  Only use bed rest as told by your health care provider. Bed rest should only be used for the most severe episodes of back pain. Standing, Sitting, and Lying Down  Do not stand in one place for long periods of time.  Use good posture when sitting. Make sure your head rests over your shoulders and is not hanging forward. Use a pillow on your lower back if necessary.  Try sleeping on your side, preferably the left side, with a pillow or two between your legs. If you are sore after a night's rest, your bed may  be too soft. A firm mattress may provide more support for your back during pregnancy. General instructions  Do not wear high heels.  Eat a healthy diet. Try to gain weight within your health care provider's recommendations.  Use a maternity girdle, elastic sling, or back brace as told by your health care provider.  Take over-the-counter and prescription medicines only as told by your health care provider.  Keep all follow-up visits as told by your health care provider. This is important. This includes any visits with any specialists, such as a physical therapist. Contact a health  care provider if:  Your back pain interferes with your daily activities.  You have increasing pain in other parts of your body. Get help right away if:  You develop numbness, tingling, weakness, or problems with the use of your arms or legs.  You develop severe back pain that is not controlled with medicine.  You have a sudden change in bowel or bladder control.  You develop shortness of breath, dizziness, or you faint.  You develop nausea, vomiting, or sweating.  You have back pain that is a rhythmic, cramping pain similar to labor pains. Labor pain is usually 1-2 minutes apart, lasts for about 1 minute, and involves a bearing down feeling or pressure in your pelvis.  You have back pain and your water breaks or you have vaginal bleeding.  You have back pain or numbness that travels down your leg.  Your back pain developed after you fell.  You develop pain on one side of your back.  You see blood in your urine.  You develop skin blisters in the area of your back pain. This information is not intended to replace advice given to you by your health care provider. Make sure you discuss any questions you have with your health care provider. Document Released: 03/30/2005 Document Revised: 05/28/2015 Document Reviewed: 09/03/2014 Elsevier Interactive Patient Education  2018 [employer]. Common Medications Safe in Pregnancy  Acne:      Constipation:  Benzoyl Peroxide     Colace  Clindamycin      Dulcolax Suppository  Topica Erythromycin     Fibercon  Salicylic Acid      Metamucil         Miralax AVOID:        Senakot   Accutane    Cough:  Retin-A       Cough Drops  Tetracycline      Phenergan w/ Codeine if Rx  Minocycline      Robitussin (Plain & DM)  Antibiotics:     Crabs/Lice:  Ceclor       RID  Cephalosporins    AVOID:  E-Mycins      Kwell  Keflex  Macrobid/Macrodantin   Diarrhea:  Penicillin      Kao-Pectate  Zithromax      Imodium AD         PUSH  FLUIDS AVOID:       Cipro     Fever:  Tetracycline      Tylenol (Regular or Extra  Minocycline       Strength)  Levaquin      Extra Strength-Do not          Exceed 8 tabs/24 hrs Caffeine:        <232m/day (equiv. To 1 cup of coffee or  approx. 3 12 oz sodas)         Gas: Cold/Hayfever:       Gas-X  Benadryl  Mylicon  Claritin       Phazyme  **Claritin-D        Chlor-Trimeton    Headaches:  Dimetapp      ASA-Free Excedrin  Drixoral-Non-Drowsy     Cold Compress  Mucinex (Guaifenasin)     Tylenol (Regular or Extra  Sudafed/Sudafed-12 Hour     Strength)  **Sudafed PE Pseudoephedrine   Tylenol Cold & Sinus     Vicks Vapor Rub  Zyrtec  **AVOID if Problems With Blood Pressure         Heartburn: Avoid lying down for at least 1 hour after meals  Aciphex      Maalox     Rash:  Milk of Magnesia     Benadryl    Mylanta       1% Hydrocortisone Cream  Pepcid  Pepcid Complete   Sleep Aids:  Prevacid      Ambien   Prilosec       Benadryl  Rolaids       Chamomile Tea  Tums (Limit 4/day)     Unisom  Zantac       Tylenol PM         Warm milk-add vanilla or  Hemorrhoids:       Sugar for taste  Anusol/Anusol H.C.  (RX: Analapram 2.5%)  Sugar Substitutes:  Hydrocortisone OTC     Ok in moderation  Preparation H      Tucks        Vaseline lotion applied to tissue with wiping    Herpes:     Throat:  Acyclovir      Oragel  Famvir  Valtrex     Vaccines:         Flu Shot Leg Cramps:       *Gardasil  Benadryl      Hepatitis A         Hepatitis B Nasal Spray:       Pneumovax  Saline Nasal Spray     Polio Booster         Tetanus Nausea:       Tuberculosis test or PPD  Vitamin B6 25 mg TID   AVOID:    Dramamine      *Gardasil  Emetrol       Live Poliovirus  Ginger Root 250 mg QID    MMR (measles, mumps &  High Complex Carbs @ Bedtime    rebella)  Sea Bands-Accupressure    Varicella (Chickenpox)  Unisom 1/2 tab TID     *No known complications           If received  before Pain:         Known pregnancy;   Darvocet       Resume series after  Lortab        Delivery  Percocet    Yeast:   Tramadol      Femstat  Tylenol 3      Gyne-lotrimin  Ultram       Monistat  Vicodin           MISC:         All Sunscreens           Hair Coloring/highlights          Insect Repellant's          (Including DEET)         Mystic Tans Round Ligament Pain The round ligament is a cord of muscle and tissue that helps to  support the uterus. It can become a source of pain during pregnancy if it becomes stretched or twisted as the baby grows. The pain usually begins in the second trimester of pregnancy, and it can come and go until the baby is delivered. It is not a serious problem, and it does not cause harm to the baby. Round ligament pain is usually a short, sharp, and pinching pain, but it can also be a dull, lingering, and aching pain. The pain is felt in the lower side of the abdomen or in the groin. It usually starts deep in the groin and moves up to the outside of the hip area. Pain can occur with:  A sudden change in position.  Rolling over in bed.  Coughing or sneezing.  Physical activity.  Follow these instructions at home: Watch your condition for any changes. Take these steps to help with your pain:  When the pain starts, relax. Then try: ? Sitting down. ? Flexing your knees up to your abdomen. ? Lying on your side with one pillow under your abdomen and another pillow between your legs. ? Sitting in a warm bath for 15-20 minutes or until the pain goes away.  Take over-the-counter and prescription medicines only as told by your health care provider.  Move slowly when you sit and stand.  Avoid long walks if they cause pain.  Stop or lessen your physical activities if they cause pain.  Contact a health care provider if:  Your pain does not go away with treatment.  You feel pain in your back that you did not have before.  Your medicine is not  helping. Get help right away if:  You develop a fever or chills.  You develop uterine contractions.  You develop vaginal bleeding.  You develop nausea or vomiting.  You develop diarrhea.  You have pain when you urinate. This information is not intended to replace advice given to you by your health care provider. Make sure you discuss any questions you have with your health care provider. Document Released: 09/29/2007 Document Revised: 05/28/2015 Document Reviewed: 02/26/2014 Elsevier Interactive Patient Education  [employer].

## 2017-06-26 NOTE — Progress Notes (Signed)
ROB-Doing well, reports round ligament pain. Discussed home treatment measures including use of abdominal support. Belly band distributed from office, paperwork signed and faxed. Anatomy scan today, complete and normal-female. Anticipatory guidance regarding course of prenatal care. Reviewed red flag symptoms and when to call. RTC x 4 weeks for ROB or sooner if needed.   ULTRASOUND REPORT  Location: [facility] Date of Service:  06/26/2017  Indications: Anatomy Findings:  Mason JimSingleton intrauterine pregnancy is visualized with FHR at 152 BPM. Biometrics give an (U/S) Gestational age of 21 3/7 weeks and an (U/S) EDD of 11/10/17; this correlates with the clinically established EDD of 11/10/17.  Fetal presentation is vertex.  EFW: 349 grams (0lb 12oz). Placenta: Anterior and grade 1. AFI: WNL subjectively.  Anatomic survey is complete and appears WNL; Gender - Female.   Right Ovary measures 3.6 x 2.7 x 2.0 cm. It is normal in appearance. Left Ovary measures 2.4 x 2.4 x 1.7 cm. It is normal appearance. There is no obvious evidence of a corpus luteal cyst. Survey of the adnexa demonstrates no adnexal masses. There is no free peritoneal fluid in the cul de sac.  Impression: 1. 20 3/7 week Viable Singleton Intrauterine pregnancy by U/S. 2. (U/S) EDD is consistent with Clinically established (LMP) EDD of 11/10/17. 3. Normal Anatomy Scan  Recommendations: 1.Clinical correlation with the patient's History and Physical Exam.

## 2017-07-25 ENCOUNTER — Ambulatory Visit (INDEPENDENT_AMBULATORY_CARE_PROVIDER_SITE_OTHER): Payer: Medicaid Other | Admitting: Obstetrics and Gynecology

## 2017-07-25 VITALS — BP 99/70 | HR 100 | Wt 167.0 lb

## 2017-07-25 DIAGNOSIS — Z3492 Encounter for supervision of normal pregnancy, unspecified, second trimester: Secondary | ICD-10-CM

## 2017-07-25 LAB — POCT URINALYSIS DIPSTICK
BILIRUBIN UA: NEGATIVE
Blood, UA: NEGATIVE
GLUCOSE UA: NEGATIVE
Ketones, UA: NEGATIVE
Leukocytes, UA: NEGATIVE
Nitrite, UA: NEGATIVE
Protein, UA: NEGATIVE
SPEC GRAV UA: 1.015 (ref 1.010–1.025)
Urobilinogen, UA: 0.2 E.U./dL
pH, UA: 6.5 (ref 5.0–8.0)

## 2017-07-25 NOTE — Progress Notes (Signed)
ROB- pt is doing well 

## 2017-07-25 NOTE — Progress Notes (Signed)
ROB-doing well, glucola next week. 

## 2017-08-22 ENCOUNTER — Encounter: Payer: Self-pay | Admitting: Certified Nurse Midwife

## 2017-08-22 ENCOUNTER — Ambulatory Visit (INDEPENDENT_AMBULATORY_CARE_PROVIDER_SITE_OTHER): Payer: Medicaid Other | Admitting: Certified Nurse Midwife

## 2017-08-22 ENCOUNTER — Other Ambulatory Visit: Payer: Medicaid Other

## 2017-08-22 VITALS — BP 94/64 | HR 97 | Wt 168.2 lb

## 2017-08-22 DIAGNOSIS — Z3492 Encounter for supervision of normal pregnancy, unspecified, second trimester: Secondary | ICD-10-CM | POA: Diagnosis not present

## 2017-08-22 DIAGNOSIS — Z130 Encounter for screening for diseases of the blood and blood-forming organs and certain disorders involving the immune mechanism: Secondary | ICD-10-CM

## 2017-08-22 DIAGNOSIS — Z131 Encounter for screening for diabetes mellitus: Secondary | ICD-10-CM

## 2017-08-22 DIAGNOSIS — Z3493 Encounter for supervision of normal pregnancy, unspecified, third trimester: Secondary | ICD-10-CM

## 2017-08-22 LAB — POCT URINALYSIS DIPSTICK OB
Bilirubin, UA: NEGATIVE
Glucose, UA: NEGATIVE — AB
KETONES UA: NEGATIVE
LEUKOCYTES UA: NEGATIVE
NITRITE UA: NEGATIVE
PH UA: 7 (ref 5.0–8.0)
PROTEIN: NEGATIVE
RBC UA: NEGATIVE
Spec Grav, UA: 1.015 (ref 1.010–1.025)
UROBILINOGEN UA: 0.2 U/dL

## 2017-08-22 MED ORDER — TETANUS-DIPHTH-ACELL PERTUSSIS 5-2.5-18.5 LF-MCG/0.5 IM SUSP
0.5000 mL | Freq: Once | INTRAMUSCULAR | Status: AC
  Administered 2017-08-22: 0.5 mL via INTRAMUSCULAR

## 2017-08-22 NOTE — Progress Notes (Signed)
ROB doing well. BTC/TDAp/RPR/CBC/1 hr glucose today. Discussed BC after baby- planning nexplanon and child birth classes. She is not planning on attending any at this time. Feels good movement. Will follow up with results . Return in 2 wks.   Doreene BurkeAnnie Melisssa Donner, CNM

## 2017-08-22 NOTE — Progress Notes (Signed)
Pt is here for an ROB visit. Labs in progress, paper signed and Tdap given

## 2017-08-22 NOTE — Patient Instructions (Signed)

## 2017-08-23 ENCOUNTER — Telehealth: Payer: Self-pay

## 2017-08-23 LAB — CBC
HEMATOCRIT: 32.1 % — AB (ref 34.0–46.6)
Hemoglobin: 10.6 g/dL — ABNORMAL LOW (ref 11.1–15.9)
MCH: 31 pg (ref 26.6–33.0)
MCHC: 33 g/dL (ref 31.5–35.7)
MCV: 94 fL (ref 79–97)
Platelets: 227 10*3/uL (ref 150–450)
RBC: 3.42 x10E6/uL — ABNORMAL LOW (ref 3.77–5.28)
RDW: 13.1 % (ref 12.3–15.4)
WBC: 5.8 10*3/uL (ref 3.4–10.8)

## 2017-08-23 LAB — GLUCOSE, 1 HOUR GESTATIONAL: Gestational Diabetes Screen: 135 mg/dL (ref 65–139)

## 2017-08-23 LAB — RPR: RPR Ser Ql: NONREACTIVE

## 2017-08-23 NOTE — Telephone Encounter (Signed)
Wireless caller not available- unable to leave a message.

## 2017-09-11 ENCOUNTER — Ambulatory Visit (INDEPENDENT_AMBULATORY_CARE_PROVIDER_SITE_OTHER): Payer: Medicaid Other | Admitting: Certified Nurse Midwife

## 2017-09-11 VITALS — BP 108/65 | HR 114 | Wt 170.0 lb

## 2017-09-11 DIAGNOSIS — Z3483 Encounter for supervision of other normal pregnancy, third trimester: Secondary | ICD-10-CM

## 2017-09-11 LAB — POCT URINALYSIS DIPSTICK OB
BILIRUBIN UA: NEGATIVE
Blood, UA: NEGATIVE
GLUCOSE, UA: NEGATIVE
Ketones, UA: NEGATIVE
Leukocytes, UA: NEGATIVE
Nitrite, UA: NEGATIVE
Spec Grav, UA: 1.01 (ref 1.010–1.025)
Urobilinogen, UA: 0.2 E.U./dL
pH, UA: 7.5 (ref 5.0–8.0)

## 2017-09-11 NOTE — Progress Notes (Signed)
ROB-Reports intermittent episodes of "dizziness". Discussed home treatment measures. Would like to postpone flu vaccine until next visit. Vitafol samples given. Anticipatory guidance regarding course of prenatal care. Reviewed red flag symptoms and when to call. RTC x 2 weeks for ROB or sooner if needed.

## 2017-09-11 NOTE — Patient Instructions (Signed)
Third Trimester of Pregnancy The third trimester is from week 29 through week 42, months 7 through 9. This trimester is when your unborn baby (fetus) is growing very fast. At the end of the ninth month, the unborn baby is about 20 inches in length. It weighs about 6-10 pounds. Follow these instructions at home:  Avoid all smoking, herbs, and alcohol. Avoid drugs not approved by your doctor.  Do not use any tobacco products, including cigarettes, chewing tobacco, and electronic cigarettes. If you need help quitting, ask your doctor. You may get counseling or other support to help you quit.  Only take medicine as told by your doctor. Some medicines are safe and some are not during pregnancy.  Exercise only as told by your doctor. Stop exercising if you start having cramps.  Eat regular, healthy meals.  Wear a good support bra if your breasts are tender.  Do not use hot tubs, steam rooms, or saunas.  Wear your seat belt when driving.  Avoid raw meat, uncooked cheese, and liter boxes and soil used by cats.  Take your prenatal vitamins.  Take 1500-2000 milligrams of calcium daily starting at the 20th week of pregnancy until you deliver your baby.  Try taking medicine that helps you poop (stool softener) as needed, and if your doctor approves. Eat more fiber by eating fresh fruit, vegetables, and whole grains. Drink enough fluids to keep your pee (urine) clear or pale yellow.  Take warm water baths (sitz baths) to soothe pain or discomfort caused by hemorrhoids. Use hemorrhoid cream if your doctor approves.  If you have puffy, bulging veins (varicose veins), wear support hose. Raise (elevate) your feet for 15 minutes, 3-4 times a day. Limit salt in your diet.  Avoid heavy lifting, wear low heels, and sit up straight.  Rest with your legs raised if you have leg cramps or low back pain.  Visit your dentist if you have not gone during your pregnancy. Use a soft toothbrush to brush your  teeth. Be gentle when you floss.  You can have sex (intercourse) unless your doctor tells you not to.  Do not travel far distances unless you must. Only do so with your doctor's approval.  Take prenatal classes.  Practice driving to the hospital.  Pack your hospital bag.  Prepare the baby's room.  Go to your doctor visits. Get help if:  You are not sure if you are in labor or if your water has broken.  You are dizzy.  You have mild cramps or pressure in your lower belly (abdominal).  You have a nagging pain in your belly area.  You continue to feel sick to your stomach (nauseous), throw up (vomit), or have watery poop (diarrhea).  You have bad smelling fluid coming from your vagina.  You have pain with peeing (urination). Get help right away if:  You have a fever.  You are leaking fluid from your vagina.  You are spotting or bleeding from your vagina.  You have severe belly cramping or pain.  You lose or gain weight rapidly.  You have trouble catching your breath and have chest pain.  You notice sudden or extreme puffiness (swelling) of your face, hands, ankles, feet, or legs.  You have not felt the baby move in over an hour.  You have severe headaches that do not go away with medicine.  You have vision changes. This information is not intended to replace advice given to you by your health care provider. Make   sure you discuss any questions you have with your health care provider. Document Released: 03/16/2009 Document Revised: 05/28/2015 Document Reviewed: 02/21/2012 Elsevier Interactive Patient Education  2017 [place]. Common Medications Safe in Pregnancy  Acne:      Constipation:  Benzoyl Peroxide     Colace  Clindamycin      Dulcolax Suppository  Topica Erythromycin     Fibercon  Salicylic Acid      Metamucil         Miralax AVOID:        Senakot   Accutane    Cough:  Retin-A       Cough Drops  Tetracycline      Phenergan w/ Codeine if  Rx  Minocycline      Robitussin (Plain & DM)  Antibiotics:     Crabs/Lice:  Ceclor       RID  Cephalosporins    AVOID:  E-Mycins      Kwell  Keflex  Macrobid/Macrodantin   Diarrhea:  Penicillin      Kao-Pectate  Zithromax      Imodium AD         PUSH FLUIDS AVOID:       Cipro     Fever:  Tetracycline      Tylenol (Regular or Extra  Minocycline       Strength)  Levaquin      Extra Strength-Do not          Exceed 8 tabs/24 hrs Caffeine:        '200mg'$ /day (equiv. To 1 cup of coffee or  approx. 3 12 oz sodas)         Gas: Cold/Hayfever:       Gas-X  Benadryl      Mylicon  Claritin       Phazyme  **Claritin-D        Chlor-Trimeton    Headaches:  Dimetapp      ASA-Free Excedrin  Drixoral-Non-Drowsy     Cold Compress  Mucinex (Guaifenasin)     Tylenol (Regular or Extra  Sudafed/Sudafed-12 Hour     Strength)  **Sudafed PE Pseudoephedrine   Tylenol Cold & Sinus     Vicks Vapor Rub  Zyrtec  **AVOID if Problems With Blood Pressure         Heartburn: Avoid lying down for at least 1 hour after meals  Aciphex      Maalox     Rash:  Milk of Magnesia     Benadryl    Mylanta       1% Hydrocortisone Cream  Pepcid  Pepcid Complete   Sleep Aids:  Prevacid      Ambien   Prilosec       Benadryl  Rolaids       Chamomile Tea  Tums (Limit 4/day)     Unisom  Zantac       Tylenol PM         Warm milk-add vanilla or  Hemorrhoids:       Sugar for taste  Anusol/Anusol H.C.  (RX: Analapram 2.5%)  Sugar Substitutes:  Hydrocortisone OTC     Ok in moderation  Preparation H      Tucks        Vaseline lotion applied to tissue with wiping    Herpes:     Throat:  Acyclovir      Oragel  Famvir  Valtrex     Vaccines:         Flu Shot Leg  Cramps:       *Gardasil  Benadryl      Hepatitis A         Hepatitis B Nasal Spray:       Pneumovax  Saline Nasal Spray     Polio Booster         Tetanus Nausea:       Tuberculosis test or PPD  Vitamin B6 25 mg  TID   AVOID:    Dramamine      *Gardasil  Emetrol       Live Poliovirus  Ginger Root 250 mg QID    MMR (measles, mumps &  High Complex Carbs @ Bedtime    rebella)  Sea Bands-Accupressure    Varicella (Chickenpox)  Unisom 1/2 tab TID     *No known complications           If received before Pain:         Known pregnancy;   Darvocet       Resume series after  Lortab        Delivery  Percocet    Yeast:   Tramadol      Femstat  Tylenol 3      Gyne-lotrimin  Ultram       Monistat  Vicodin           MISC:         All Sunscreens           Hair Coloring/highlights          Insect Repellant's          (Including DEET)         Mystic Tans Round Ligament Pain The round ligament is a cord of muscle and tissue that helps to support the uterus. It can become a source of pain during pregnancy if it becomes stretched or twisted as the baby grows. The pain usually begins in the second trimester of pregnancy, and it can come and go until the baby is delivered. It is not a serious problem, and it does not cause harm to the baby. Round ligament pain is usually a short, sharp, and pinching pain, but it can also be a dull, lingering, and aching pain. The pain is felt in the lower side of the abdomen or in the groin. It usually starts deep in the groin and moves up to the outside of the hip area. Pain can occur with:  A sudden change in position.  Rolling over in bed.  Coughing or sneezing.  Physical activity.  Follow these instructions at home: Watch your condition for any changes. Take these steps to help with your pain:  When the pain starts, relax. Then try: ? Sitting down. ? Flexing your knees up to your abdomen. ? Lying on your side with one pillow under your abdomen and another pillow between your legs. ? Sitting in a warm bath for 15-20 minutes or until the pain goes away.  Take over-the-counter and prescription medicines only as told by your health care provider.  Move slowly when you  sit and stand.  Avoid long walks if they cause pain.  Stop or lessen your physical activities if they cause pain.  Contact a health care provider if:  Your pain does not go away with treatment.  You feel pain in your back that you did not have before.  Your medicine is not helping. Get help right away if:  You develop a fever or chills.  You develop uterine contractions.  You develop vaginal bleeding.  You develop nausea or vomiting.  You develop diarrhea.  You have pain when you urinate. This information is not intended to replace advice given to you by your health care provider. Make sure you discuss any questions you have with your health care provider. Document Released: 09/29/2007 Document Revised: 05/28/2015 Document Reviewed: 02/26/2014 Elsevier Interactive Patient Education  2018 [place]. Back Pain in Pregnancy Back pain during pregnancy is common. Back pain may be caused by several factors that are related to changes during your pregnancy. Follow these instructions at home: Managing pain, stiffness, and swelling  If directed, apply ice for sudden (acute) back pain. ? Put ice in a plastic bag. ? Place a towel between your skin and the bag. ? Leave the ice on for 20 minutes, 2-3 times per day.  If directed, apply heat to the affected area before you exercise: ? Place a towel between your skin and the heat pack or heating pad. ? Leave the heat on for 20-30 minutes. ? Remove the heat if your skin turns bright red. This is especially important if you are unable to feel pain, heat, or cold. You may have a greater risk of getting burned. Activity  Exercise as told by your health care provider. Exercising is the best way to prevent or manage back pain.  Listen to your body when lifting. If lifting hurts, ask for help or bend your knees. This uses your leg muscles instead of your back muscles.  Squat down when picking up something from the floor. Do not bend  over.  Only use bed rest as told by your health care provider. Bed rest should only be used for the most severe episodes of back pain. Standing, Sitting, and Lying Down  Do not stand in one place for long periods of time.  Use good posture when sitting. Make sure your head rests over your shoulders and is not hanging forward. Use a pillow on your lower back if necessary.  Try sleeping on your side, preferably the left side, with a pillow or two between your legs. If you are sore after a night's rest, your bed may be too soft. A firm mattress may provide more support for your back during pregnancy. General instructions  Do not wear high heels.  Eat a healthy diet. Try to gain weight within your health care provider's recommendations.  Use a maternity girdle, elastic sling, or back brace as told by your health care provider.  Take over-the-counter and prescription medicines only as told by your health care provider.  Keep all follow-up visits as told by your health care provider. This is important. This includes any visits with any specialists, such as a physical therapist. Contact a health care provider if:  Your back pain interferes with your daily activities.  You have increasing pain in other parts of your body. Get help right away if:  You develop numbness, tingling, weakness, or problems with the use of your arms or legs.  You develop severe back pain that is not controlled with medicine.  You have a sudden change in bowel or bladder control.  You develop shortness of breath, dizziness, or you faint.  You develop nausea, vomiting, or sweating.  You have back pain that is a rhythmic, cramping pain similar to labor pains. Labor pain is usually 1-2 minutes apart, lasts for about 1 minute, and involves a bearing down feeling or pressure in your pelvis.  You have back pain and your  water breaks or you have vaginal bleeding.  You have back pain or numbness that travels down  your leg.  Your back pain developed after you fell.  You develop pain on one side of your back.  You see blood in your urine.  You develop skin blisters in the area of your back pain. This information is not intended to replace advice given to you by your health care provider. Make sure you discuss any questions you have with your health care provider. Document Released: 03/30/2005 Document Revised: 05/28/2015 Document Reviewed: 09/03/2014 Elsevier Interactive Patient Education  [employer].

## 2017-09-11 NOTE — Progress Notes (Signed)
Pt presents today for ROB, complains of dizzy spells.

## 2017-09-25 ENCOUNTER — Encounter: Payer: Self-pay | Admitting: Certified Nurse Midwife

## 2017-09-25 ENCOUNTER — Ambulatory Visit (INDEPENDENT_AMBULATORY_CARE_PROVIDER_SITE_OTHER): Payer: Medicaid Other | Admitting: Certified Nurse Midwife

## 2017-09-25 VITALS — BP 96/63 | HR 94 | Wt 170.3 lb

## 2017-09-25 DIAGNOSIS — Z3483 Encounter for supervision of other normal pregnancy, third trimester: Secondary | ICD-10-CM | POA: Diagnosis not present

## 2017-09-25 LAB — POCT URINALYSIS DIPSTICK OB
Bilirubin, UA: NEGATIVE
Blood, UA: NEGATIVE
Glucose, UA: NEGATIVE
Ketones, UA: NEGATIVE
LEUKOCYTES UA: NEGATIVE
NITRITE UA: NEGATIVE
PH UA: 8 (ref 5.0–8.0)
PROTEIN: NEGATIVE
Spec Grav, UA: 1.01 (ref 1.010–1.025)
Urobilinogen, UA: 0.2 E.U./dL

## 2017-09-25 NOTE — Progress Notes (Signed)
ROB. Doing well. Feels good movement. Has occasional contraction. Discussed BC after delivery, Is considering IUD. Mirena vs ParaGard. Thinks she will do mirena. Follow up 2 wks.   Doreene BurkeAnnie Uriel Dowding, CNM

## 2017-09-25 NOTE — Patient Instructions (Signed)
Group B Streptococcus Infection During Pregnancy Group B Streptococcus (GBS) is a type of bacteria (Streptococcus agalactiae) that is often found in healthy people, commonly in the rectum, vagina, and intestines. In people who are healthy and not pregnant, the bacteria rarely cause serious illness or complications. However, women who test positive for GBS during pregnancy can pass the bacteria to their baby during childbirth, which can cause serious infection in the baby after birth. Women with GBS may also have infections during their pregnancy or immediately after childbirth, such as such as urinary tract infections (UTIs) or infections of the uterus (uterine infections). Having GBS also increases a woman's risk of complications during pregnancy, such as early (preterm) labor or delivery, miscarriage, or stillbirth. Routine testing (screening) for GBS is recommended for all pregnant women. What increases the risk? You may have a higher risk for GBS infection during pregnancy if you had one during a past pregnancy. What are the signs or symptoms? In most cases, GBS infection does not cause symptoms in pregnant women. Signs and symptoms of a possible GBS-related infection may include:  Labor starting before the 37th week of pregnancy.  A UTI or bladder infection, which may cause: ? Fever. ? Pain or burning during urination. ? Frequent urination.  Fever during labor, along with: ? Bad-smelling discharge. ? Uterine tenderness. ? Rapid heartbeat in the mother, baby, or both.  Rare but serious symptoms of a possible GBS-related infection in women include:  Blood infection (septicemia). This may cause fever, chills, or confusion.  Lung infection (pneumonia). This may cause fever, chills, cough, rapid breathing, difficulty breathing, or chest pain.  Bone, joint, skin, or soft tissue infection.  How is this diagnosed? You may be screened for GBS between week 35 and week 37 of your pregnancy. If  you have symptoms of preterm labor, you may be screened earlier. This condition is diagnosed based on lab test results from:  A swab of fluid from the vagina and rectum.  A urine sample.  How is this treated? This condition is treated with antibiotic medicine. When you go into labor, or as soon as your water breaks (your membranes rupture), you will be given antibiotics through an IV tube. Antibiotics will continue until after you give birth. If you are having a cesarean delivery, you do not need antibiotics unless your membranes have already ruptured. Follow these instructions at home:  Take over-the-counter and prescription medicines only as told by your health care provider.  Take your antibiotic medicine as told by your health care provider. Do not stop taking the antibiotic even if you start to feel better.  Keep all pre-birth (prenatal) visits and follow-up visits as told by your health care provider. This is important. Contact a health care provider if:  You have pain or burning when you urinate.  You have to urinate frequently.  You have a fever or chills.  You develop a bad-smelling vaginal discharge. Get help right away if:  Your membranes rupture.  You go into labor.  You have severe pain in your abdomen.  You have difficulty breathing.  You have chest pain. This information is not intended to replace advice given to you by your health care provider. Make sure you discuss any questions you have with your health care provider. Document Released: 03/29/2007 Document Revised: 07/17/2015 Document Reviewed: 07/16/2015 Elsevier Interactive Patient Education  2018 [employer]  

## 2017-10-10 ENCOUNTER — Encounter: Payer: Medicaid Other | Admitting: Obstetrics and Gynecology

## 2017-10-12 ENCOUNTER — Ambulatory Visit (INDEPENDENT_AMBULATORY_CARE_PROVIDER_SITE_OTHER): Payer: Medicaid Other | Admitting: Certified Nurse Midwife

## 2017-10-12 ENCOUNTER — Encounter: Payer: Self-pay | Admitting: Certified Nurse Midwife

## 2017-10-12 VITALS — BP 100/65 | HR 119 | Wt 175.1 lb

## 2017-10-12 DIAGNOSIS — Z3483 Encounter for supervision of other normal pregnancy, third trimester: Secondary | ICD-10-CM

## 2017-10-12 DIAGNOSIS — Z3685 Encounter for antenatal screening for Streptococcus B: Secondary | ICD-10-CM

## 2017-10-12 DIAGNOSIS — Z202 Contact with and (suspected) exposure to infections with a predominantly sexual mode of transmission: Secondary | ICD-10-CM

## 2017-10-12 LAB — POCT URINALYSIS DIPSTICK OB
BILIRUBIN UA: NEGATIVE
Glucose, UA: NEGATIVE
KETONES UA: NEGATIVE
Leukocytes, UA: NEGATIVE
NITRITE UA: NEGATIVE
PH UA: 6.5 (ref 5.0–8.0)
PROTEIN: NEGATIVE
RBC UA: NEGATIVE
UROBILINOGEN UA: 0.2 U/dL

## 2017-10-12 MED ORDER — VITAFOL ULTRA 29-0.6-0.4-200 MG PO CAPS: 1.0000 | ORAL_CAPSULE | Freq: Every day | ORAL | 11 refills | Status: DC

## 2017-10-12 NOTE — Patient Instructions (Signed)
Vaginal Delivery Vaginal delivery means that you will give birth by pushing your baby out of your birth canal (vagina). A team of health care providers will help you before, during, and after vaginal delivery. Birth experiences are unique for every woman and every pregnancy, and birth experiences vary depending on where you choose to give birth. What should I do to prepare for my baby's birth? Before your baby is born, it is important to talk with your health care provider about:  Your labor and delivery preferences. These may include: ? Medicines that you may be given. ? How you will manage your pain. This might include non-medical pain relief techniques or injectable pain relief such as epidural analgesia. ? How you and your baby will be monitored during labor and delivery. ? Who may be in the labor and delivery room with you. ? Your feelings about surgical delivery of your baby (cesarean delivery, or C-section) if this becomes necessary. ? Your feelings about receiving donated blood through an IV tube (blood transfusion) if this becomes necessary.  Whether you are able: ? To take pictures or videos of the birth. ? To eat during labor and delivery. ? To move around, walk, or change positions during labor and delivery.  What to expect after your baby is born, such as: ? Whether delayed umbilical cord clamping and cutting is offered. ? Who will care for your baby right after birth. ? Medicines or tests that may be recommended for your baby. ? Whether breastfeeding is supported in your hospital or birth center. ? How long you will be in the hospital or birth center.  How any medical conditions you have may affect your baby or your labor and delivery experience.  To prepare for your baby's birth, you should also:  Attend all of your health care visits before delivery (prenatal visits) as recommended by your health care provider. This is important.  Prepare your home for your baby's  arrival. Make sure that you have: ? Diapers. ? Baby clothing. ? Feeding equipment. ? Safe sleeping arrangements for you and your baby.  Install a car seat in your vehicle. Have your car seat checked by a certified car seat installer to make sure that it is installed safely.  Think about who will help you with your new baby at home for at least the first several weeks after delivery.  What can I expect when I arrive at the birth center or hospital? Once you are in labor and have been admitted into the hospital or birth center, your health care provider may:  Review your pregnancy history and any concerns you have.  Insert an IV tube into one of your veins. This is used to give you fluids and medicines.  Check your blood pressure, pulse, temperature, and heart rate (vital signs).  Check whether your bag of water (amniotic sac) has broken (ruptured).  Talk with you about your birth plan and discuss pain control options.  Monitoring Your health care provider may monitor your contractions (uterine monitoring) and your baby's heart rate (fetal monitoring). You may need to be monitored:  Often, but not continuously (intermittently).  All the time or for long periods at a time (continuously). Continuous monitoring may be needed if: ? You are taking certain medicines, such as medicine to relieve pain or make your contractions stronger. ? You have pregnancy or labor complications.  Monitoring may be done by:  Placing a special stethoscope or a handheld monitoring device on your abdomen to   check your baby's heartbeat, and feeling your abdomen for contractions. This method of monitoring does not continuously record your baby's heartbeat or your contractions.  Placing monitors on your abdomen (external monitors) to record your baby's heartbeat and the frequency and length of contractions. You may not have to wear external monitors all the time.  Placing monitors inside of your uterus  (internal monitors) to record your baby's heartbeat and the frequency, length, and strength of your contractions. ? Your health care provider may use internal monitors if he or she needs more information about the strength of your contractions or your baby's heart rate. ? Internal monitors are put in place by passing a thin, flexible wire through your vagina and into your uterus. Depending on the type of monitor, it may remain in your uterus or on your baby's head until birth. ? Your health care provider will discuss the benefits and risks of internal monitoring with you and will ask for your permission before inserting the monitors.  Telemetry. This is a type of continuous monitoring that can be done with external or internal monitors. Instead of having to stay in bed, you are able to move around during telemetry. Ask your health care provider if telemetry is an option for you.  Physical exam Your health care provider may perform a physical exam. This may include:  Checking whether your baby is positioned: ? With the head toward your vagina (head-down). This is most common. ? With the head toward the top of your uterus (head-up or breech). If your baby is in a breech position, your health care provider may try to turn your baby to a head-down position so you can deliver vaginally. If it does not seem that your baby can be born vaginally, your provider may recommend surgery to deliver your baby. In rare cases, you may be able to deliver vaginally if your baby is head-up (breech delivery). ? Lying sideways (transverse). Babies that are lying sideways cannot be delivered vaginally.  Checking your cervix to determine: ? Whether it is thinning out (effacing). ? Whether it is opening up (dilating). ? How low your baby has moved into your birth canal.  What are the three stages of labor and delivery?  Normal labor and delivery is divided into the following three stages: Stage 1  Stage 1 is the  longest stage of labor, and it can last for hours or days. Stage 1 includes: ? Early labor. This is when contractions may be irregular, or regular and mild. Generally, early labor contractions are more than 10 minutes apart. ? Active labor. This is when contractions get longer, more regular, more frequent, and more intense. ? The transition phase. This is when contractions happen very close together, are very intense, and may last longer than during any other part of labor.  Contractions generally feel mild, infrequent, and irregular at first. They get stronger, more frequent (about every 2-3 minutes), and more regular as you progress from early labor through active labor and transition.  Many women progress through stage 1 naturally, but you may need help to continue making progress. If this happens, your health care provider may talk with you about: ? Rupturing your amniotic sac if it has not ruptured yet. ? Giving you medicine to help make your contractions stronger and more frequent.  Stage 1 ends when your cervix is completely dilated to 4 inches (10 cm) and completely effaced. This happens at the end of the transition phase. Stage 2  Once   your cervix is completely effaced and dilated to 4 inches (10 cm), you may start to feel an urge to push. It is common for the body to naturally take a rest before feeling the urge to push, especially if you received an epidural or certain other pain medicines. This rest period may last for up to 1-2 hours, depending on your unique labor experience.  During stage 2, contractions are generally less painful, because pushing helps relieve contraction pain. Instead of contraction pain, you may feel stretching and burning pain, especially when the widest part of your baby's head passes through the vaginal opening (crowning).  Your health care provider will closely monitor your pushing progress and your baby's progress through the vagina during stage 2.  Your  health care provider may massage the area of skin between your vaginal opening and anus (perineum) or apply warm compresses to your perineum. This helps it stretch as the baby's head starts to crown, which can help prevent perineal tearing. ? In some cases, an incision may be made in your perineum (episiotomy) to allow the baby to pass through the vaginal opening. An episiotomy helps to make the opening of the vagina larger to allow more room for the baby to fit through.  It is very important to breathe and focus so your health care provider can control the delivery of your baby's head. Your health care provider may have you decrease the intensity of your pushing, to help prevent perineal tearing.  After delivery of your baby's head, the shoulders and the rest of the body generally deliver very quickly and without difficulty.  Once your baby is delivered, the umbilical cord may be cut right away, or this may be delayed for 1-2 minutes, depending on your baby's health. This may vary among health care providers, hospitals, and birth centers.  If you and your baby are healthy enough, your baby may be placed on your chest or abdomen to help maintain the baby's temperature and to help you bond with each other. Some mothers and babies start breastfeeding at this time. Your health care team will dry your baby and help keep your baby warm during this time.  Your baby may need immediate care if he or she: ? Showed signs of distress during labor. ? Has a medical condition. ? Was born too early (prematurely). ? Had a bowel movement before birth (meconium). ? Shows signs of difficulty transitioning from being inside the uterus to being outside of the uterus. If you are planning to breastfeed, your health care team will help you begin a feeding. Stage 3  The third stage of labor starts immediately after the birth of your baby and ends after you deliver the placenta. The placenta is an organ that develops  during pregnancy to provide oxygen and nutrients to your baby in the womb.  Delivering the placenta may require some pushing, and you may have mild contractions. Breastfeeding can stimulate contractions to help you deliver the placenta.  After the placenta is delivered, your uterus should tighten (contract) and become firm. This helps to stop bleeding in your uterus. To help your uterus contract and to control bleeding, your health care provider may: ? Give you medicine by injection, through an IV tube, by mouth, or through your rectum (rectally). ? Massage your abdomen or perform a vaginal exam to remove any blood clots that are left in your uterus. ? Empty your bladder by placing a thin, flexible tube (catheter) into your bladder. ? Encourage   you to breastfeed your baby. After labor is over, you and your baby will be monitored closely to ensure that you are both healthy until you are ready to go home. Your health care team will teach you how to care for yourself and your baby. This information is not intended to replace advice given to you by your health care provider. Make sure you discuss any questions you have with your health care provider. Document Released: 09/29/2007 Document Revised: 07/10/2015 Document Reviewed: 01/04/2015 Elsevier Interactive Patient Education  2018 [employer]  

## 2017-10-12 NOTE — Progress Notes (Signed)
ROB-Reports intermittent pelvic pressure, requests SVE. 36 week cultures collected. Herbal prep handout given. Anticipatory guidance regarding course of prenatal care. Reviewed red flag symptoms and when to call. RTC x 1 week for ROB or sooner if needed.

## 2017-10-12 NOTE — Progress Notes (Signed)
ROB, c/o intermittent lower pelvic pain and pressure that started 2 weeks ago.

## 2017-10-14 LAB — STREP GP B NAA: Strep Gp B NAA: NEGATIVE

## 2017-10-14 LAB — GC/CHLAMYDIA PROBE AMP
Chlamydia trachomatis, NAA: NEGATIVE
NEISSERIA GONORRHOEAE BY PCR: NEGATIVE

## 2017-10-19 ENCOUNTER — Ambulatory Visit (INDEPENDENT_AMBULATORY_CARE_PROVIDER_SITE_OTHER): Payer: Medicaid Other | Admitting: Certified Nurse Midwife

## 2017-10-19 VITALS — BP 100/68 | HR 105 | Wt 177.7 lb

## 2017-10-19 DIAGNOSIS — Z3483 Encounter for supervision of other normal pregnancy, third trimester: Secondary | ICD-10-CM | POA: Diagnosis not present

## 2017-10-19 LAB — POCT URINALYSIS DIPSTICK OB
Bilirubin, UA: NEGATIVE
Glucose, UA: NEGATIVE
Ketones, UA: NEGATIVE
LEUKOCYTES UA: NEGATIVE
Nitrite, UA: NEGATIVE
PH UA: 6 (ref 5.0–8.0)
PROTEIN: NEGATIVE
RBC UA: NEGATIVE
Spec Grav, UA: 1.01 (ref 1.010–1.025)
Urobilinogen, UA: 0.2 E.U./dL

## 2017-10-19 NOTE — Patient Instructions (Signed)
Vaginal Delivery Vaginal delivery means that you will give birth by pushing your baby out of your birth canal (vagina). A team of health care providers will help you before, during, and after vaginal delivery. Birth experiences are unique for every woman and every pregnancy, and birth experiences vary depending on where you choose to give birth. What should I do to prepare for my baby's birth? Before your baby is born, it is important to talk with your health care provider about:  Your labor and delivery preferences. These may include: ? Medicines that you may be given. ? How you will manage your pain. This might include non-medical pain relief techniques or injectable pain relief such as epidural analgesia. ? How you and your baby will be monitored during labor and delivery. ? Who may be in the labor and delivery room with you. ? Your feelings about surgical delivery of your baby (cesarean delivery, or C-section) if this becomes necessary. ? Your feelings about receiving donated blood through an IV tube (blood transfusion) if this becomes necessary.  Whether you are able: ? To take pictures or videos of the birth. ? To eat during labor and delivery. ? To move around, walk, or change positions during labor and delivery.  What to expect after your baby is born, such as: ? Whether delayed umbilical cord clamping and cutting is offered. ? Who will care for your baby right after birth. ? Medicines or tests that may be recommended for your baby. ? Whether breastfeeding is supported in your hospital or birth center. ? How long you will be in the hospital or birth center.  How any medical conditions you have may affect your baby or your labor and delivery experience.  To prepare for your baby's birth, you should also:  Attend all of your health care visits before delivery (prenatal visits) as recommended by your health care provider. This is important.  Prepare your home for your baby's  arrival. Make sure that you have: ? Diapers. ? Baby clothing. ? Feeding equipment. ? Safe sleeping arrangements for you and your baby.  Install a car seat in your vehicle. Have your car seat checked by a certified car seat installer to make sure that it is installed safely.  Think about who will help you with your new baby at home for at least the first several weeks after delivery.  What can I expect when I arrive at the birth center or hospital? Once you are in labor and have been admitted into the hospital or birth center, your health care provider may:  Review your pregnancy history and any concerns you have.  Insert an IV tube into one of your veins. This is used to give you fluids and medicines.  Check your blood pressure, pulse, temperature, and heart rate (vital signs).  Check whether your bag of water (amniotic sac) has broken (ruptured).  Talk with you about your birth plan and discuss pain control options.  Monitoring Your health care provider may monitor your contractions (uterine monitoring) and your baby's heart rate (fetal monitoring). You may need to be monitored:  Often, but not continuously (intermittently).  All the time or for long periods at a time (continuously). Continuous monitoring may be needed if: ? You are taking certain medicines, such as medicine to relieve pain or make your contractions stronger. ? You have pregnancy or labor complications.  Monitoring may be done by:  Placing a special stethoscope or a handheld monitoring device on your abdomen to   check your baby's heartbeat, and feeling your abdomen for contractions. This method of monitoring does not continuously record your baby's heartbeat or your contractions.  Placing monitors on your abdomen (external monitors) to record your baby's heartbeat and the frequency and length of contractions. You may not have to wear external monitors all the time.  Placing monitors inside of your uterus  (internal monitors) to record your baby's heartbeat and the frequency, length, and strength of your contractions. ? Your health care provider may use internal monitors if he or she needs more information about the strength of your contractions or your baby's heart rate. ? Internal monitors are put in place by passing a thin, flexible wire through your vagina and into your uterus. Depending on the type of monitor, it may remain in your uterus or on your baby's head until birth. ? Your health care provider will discuss the benefits and risks of internal monitoring with you and will ask for your permission before inserting the monitors.  Telemetry. This is a type of continuous monitoring that can be done with external or internal monitors. Instead of having to stay in bed, you are able to move around during telemetry. Ask your health care provider if telemetry is an option for you.  Physical exam Your health care provider may perform a physical exam. This may include:  Checking whether your baby is positioned: ? With the head toward your vagina (head-down). This is most common. ? With the head toward the top of your uterus (head-up or breech). If your baby is in a breech position, your health care provider may try to turn your baby to a head-down position so you can deliver vaginally. If it does not seem that your baby can be born vaginally, your provider may recommend surgery to deliver your baby. In rare cases, you may be able to deliver vaginally if your baby is head-up (breech delivery). ? Lying sideways (transverse). Babies that are lying sideways cannot be delivered vaginally.  Checking your cervix to determine: ? Whether it is thinning out (effacing). ? Whether it is opening up (dilating). ? How low your baby has moved into your birth canal.  What are the three stages of labor and delivery?  Normal labor and delivery is divided into the following three stages: Stage 1  Stage 1 is the  longest stage of labor, and it can last for hours or days. Stage 1 includes: ? Early labor. This is when contractions may be irregular, or regular and mild. Generally, early labor contractions are more than 10 minutes apart. ? Active labor. This is when contractions get longer, more regular, more frequent, and more intense. ? The transition phase. This is when contractions happen very close together, are very intense, and may last longer than during any other part of labor.  Contractions generally feel mild, infrequent, and irregular at first. They get stronger, more frequent (about every 2-3 minutes), and more regular as you progress from early labor through active labor and transition.  Many women progress through stage 1 naturally, but you may need help to continue making progress. If this happens, your health care provider may talk with you about: ? Rupturing your amniotic sac if it has not ruptured yet. ? Giving you medicine to help make your contractions stronger and more frequent.  Stage 1 ends when your cervix is completely dilated to 4 inches (10 cm) and completely effaced. This happens at the end of the transition phase. Stage 2  Once   your cervix is completely effaced and dilated to 4 inches (10 cm), you may start to feel an urge to push. It is common for the body to naturally take a rest before feeling the urge to push, especially if you received an epidural or certain other pain medicines. This rest period may last for up to 1-2 hours, depending on your unique labor experience.  During stage 2, contractions are generally less painful, because pushing helps relieve contraction pain. Instead of contraction pain, you may feel stretching and burning pain, especially when the widest part of your baby's head passes through the vaginal opening (crowning).  Your health care provider will closely monitor your pushing progress and your baby's progress through the vagina during stage 2.  Your  health care provider may massage the area of skin between your vaginal opening and anus (perineum) or apply warm compresses to your perineum. This helps it stretch as the baby's head starts to crown, which can help prevent perineal tearing. ? In some cases, an incision may be made in your perineum (episiotomy) to allow the baby to pass through the vaginal opening. An episiotomy helps to make the opening of the vagina larger to allow more room for the baby to fit through.  It is very important to breathe and focus so your health care provider can control the delivery of your baby's head. Your health care provider may have you decrease the intensity of your pushing, to help prevent perineal tearing.  After delivery of your baby's head, the shoulders and the rest of the body generally deliver very quickly and without difficulty.  Once your baby is delivered, the umbilical cord may be cut right away, or this may be delayed for 1-2 minutes, depending on your baby's health. This may vary among health care providers, hospitals, and birth centers.  If you and your baby are healthy enough, your baby may be placed on your chest or abdomen to help maintain the baby's temperature and to help you bond with each other. Some mothers and babies start breastfeeding at this time. Your health care team will dry your baby and help keep your baby warm during this time.  Your baby may need immediate care if he or she: ? Showed signs of distress during labor. ? Has a medical condition. ? Was born too early (prematurely). ? Had a bowel movement before birth (meconium). ? Shows signs of difficulty transitioning from being inside the uterus to being outside of the uterus. If you are planning to breastfeed, your health care team will help you begin a feeding. Stage 3  The third stage of labor starts immediately after the birth of your baby and ends after you deliver the placenta. The placenta is an organ that develops  during pregnancy to provide oxygen and nutrients to your baby in the womb.  Delivering the placenta may require some pushing, and you may have mild contractions. Breastfeeding can stimulate contractions to help you deliver the placenta.  After the placenta is delivered, your uterus should tighten (contract) and become firm. This helps to stop bleeding in your uterus. To help your uterus contract and to control bleeding, your health care provider may: ? Give you medicine by injection, through an IV tube, by mouth, or through your rectum (rectally). ? Massage your abdomen or perform a vaginal exam to remove any blood clots that are left in your uterus. ? Empty your bladder by placing a thin, flexible tube (catheter) into your bladder. ? Encourage   you to breastfeed your baby. After labor is over, you and your baby will be monitored closely to ensure that you are both healthy until you are ready to go home. Your health care team will teach you how to care for yourself and your baby. This information is not intended to replace advice given to you by your health care provider. Make sure you discuss any questions you have with your health care provider. Document Released: 09/29/2007 Document Revised: 07/10/2015 Document Reviewed: 01/04/2015 Elsevier Interactive Patient Education  2018 [employer]  

## 2017-10-19 NOTE — Progress Notes (Signed)
ROB-Doing well, reports intermittent pelvic pressure. Discussed home treatment measures and labor preparation techniques. Reviewed red flag symptoms and when to call. RTC x 1 week for ROB or sooner if needed.

## 2017-10-19 NOTE — Progress Notes (Signed)
ROB, no complaints.  

## 2017-11-06 ENCOUNTER — Ambulatory Visit (INDEPENDENT_AMBULATORY_CARE_PROVIDER_SITE_OTHER): Payer: Medicaid Other | Admitting: Certified Nurse Midwife

## 2017-11-06 ENCOUNTER — Encounter: Payer: Self-pay | Admitting: Certified Nurse Midwife

## 2017-11-06 ENCOUNTER — Telehealth: Payer: Self-pay

## 2017-11-06 VITALS — BP 100/60 | HR 109 | Wt 182.2 lb

## 2017-11-06 DIAGNOSIS — Z3483 Encounter for supervision of other normal pregnancy, third trimester: Secondary | ICD-10-CM

## 2017-11-06 LAB — POCT URINALYSIS DIPSTICK
BILIRUBIN UA: NEGATIVE
GLUCOSE UA: NEGATIVE
KETONES UA: NEGATIVE
LEUKOCYTES UA: NEGATIVE
Nitrite, UA: NEGATIVE
Protein, UA: NEGATIVE
RBC UA: NEGATIVE
SPEC GRAV UA: 1.01 (ref 1.010–1.025)
Urobilinogen, UA: 0.2 E.U./dL
pH, UA: 6.5 (ref 5.0–8.0)

## 2017-11-06 NOTE — Progress Notes (Signed)
ROB doing well. No complaints. Feels fetal movement. Dicussed labor precautions. Follow up 1 wk for BPP and ROB with Melody.   Doreene Burke, CNM

## 2017-11-06 NOTE — Patient Instructions (Signed)
Braxton Hicks Contractions °Contractions of the uterus can occur throughout pregnancy, but they are not always a sign that you are in labor. You may have practice contractions called Braxton Hicks contractions. These false labor contractions are sometimes confused with true labor. °What are Braxton Hicks contractions? °Braxton Hicks contractions are tightening movements that occur in the muscles of the uterus before labor. Unlike true labor contractions, these contractions do not result in opening (dilation) and thinning of the cervix. Toward the end of pregnancy (32-34 weeks), Braxton Hicks contractions can happen more often and may become stronger. These contractions are sometimes difficult to tell apart from true labor because they can be very uncomfortable. You should not feel embarrassed if you go to the hospital with false labor. °Sometimes, the only way to tell if you are in true labor is for your health care provider to look for changes in the cervix. The health care provider will do a physical exam and may monitor your contractions. If you are not in true labor, the exam should show that your cervix is not dilating and your water has not broken. °If there are other health problems associated with your pregnancy, it is completely safe for you to be sent home with false labor. You may continue to have Braxton Hicks contractions until you go into true labor. °How to tell the difference between true labor and false labor °True labor °· Contractions last 30-70 seconds. °· Contractions become very regular. °· Discomfort is usually felt in the top of the uterus, and it spreads to the lower abdomen and low back. °· Contractions do not go away with walking. °· Contractions usually become more intense and increase in frequency. °· The cervix dilates and gets thinner. °False labor °· Contractions are usually shorter and not as strong as true labor contractions. °· Contractions are usually irregular. °· Contractions  are often felt in the front of the lower abdomen and in the groin. °· Contractions may go away when you walk around or change positions while lying down. °· Contractions get weaker and are shorter-lasting as time goes on. °· The cervix usually does not dilate or become thin. °Follow these instructions at home: °· Take over-the-counter and prescription medicines only as told by your health care provider. °· Keep up with your usual exercises and follow other instructions from your health care provider. °· Eat and drink lightly if you think you are going into labor. °· If Braxton Hicks contractions are making you uncomfortable: °? Change your position from lying down or resting to walking, or change from walking to resting. °? Sit and rest in a tub of warm water. °? Drink enough fluid to keep your urine pale yellow. Dehydration may cause these contractions. °? Do slow and deep breathing several times an hour. °· Keep all follow-up prenatal visits as told by your health care provider. This is important. °Contact a health care provider if: °· You have a fever. °· You have continuous pain in your abdomen. °Get help right away if: °· Your contractions become stronger, more regular, and closer together. °· You have fluid leaking or gushing from your vagina. °· You pass blood-tinged mucus (bloody show). °· You have bleeding from your vagina. °· You have low back pain that you never had before. °· You feel your baby’s head pushing down and causing pelvic pressure. °· Your baby is not moving inside you as much as it used to. °Summary °· Contractions that occur before labor are called Braxton   Hicks contractions, false labor, or practice contractions. °· Braxton Hicks contractions are usually shorter, weaker, farther apart, and less regular than true labor contractions. True labor contractions usually become progressively stronger and regular and they become more frequent. °· Manage discomfort from Braxton Hicks contractions by  changing position, resting in a warm bath, drinking plenty of water, or practicing deep breathing. °This information is not intended to replace advice given to you by your health care provider. Make sure you discuss any questions you have with your health care provider. °Document Released: 05/05/2016 Document Revised: 05/05/2016 Document Reviewed: 05/05/2016 °Elsevier Interactive Patient Education © 2018 [employer] Inc. ° °

## 2017-11-06 NOTE — Telephone Encounter (Signed)
Pt called requesting to speak to AT. AT not in office. Offered to speak with pt and try to answer her questions. She also gave verbal consent for me to speak with her grandmother- Ferd Glassing. Pt states she was told she would have to " end the pregnancy". This Clinical research associate explained what was meant by that phrase - end the length of this pregnancy- not the babys life. BPP was also explained to pt. Pt and her grandmother agreed there was a misunderstanding. They also inquired as to whom would be delivering. Explained to them there were 3 midwives that rotate and the person that was on call when she is to deliver will be the one. When asked if everything was clearly explained they both agreed yes it was. Encouraged to call back if they have any further questions or concerns. Pt and grandmother were satisfied.

## 2017-11-07 ENCOUNTER — Other Ambulatory Visit: Payer: Self-pay | Admitting: Certified Nurse Midwife

## 2017-11-07 DIAGNOSIS — O480 Post-term pregnancy: Secondary | ICD-10-CM

## 2017-11-08 ENCOUNTER — Encounter: Payer: Medicaid Other | Admitting: Certified Nurse Midwife

## 2017-11-08 ENCOUNTER — Inpatient Hospital Stay: Payer: Medicaid Other | Admitting: Anesthesiology

## 2017-11-08 ENCOUNTER — Inpatient Hospital Stay
Admission: EM | Admit: 2017-11-08 | Discharge: 2017-11-10 | DRG: 833 | Disposition: A | Discharge status: Home / Self Care | Payer: Medicaid Other | Attending: Certified Nurse Midwife | Admitting: Certified Nurse Midwife

## 2017-11-08 ENCOUNTER — Ambulatory Visit (INDEPENDENT_AMBULATORY_CARE_PROVIDER_SITE_OTHER): Payer: Medicaid Other

## 2017-11-08 ENCOUNTER — Ambulatory Visit (INDEPENDENT_AMBULATORY_CARE_PROVIDER_SITE_OTHER): Payer: Medicaid Other | Admitting: Obstetrics and Gynecology

## 2017-11-08 ENCOUNTER — Other Ambulatory Visit: Payer: Self-pay

## 2017-11-08 VITALS — BP 107/68 | HR 92 | Wt 179.6 lb

## 2017-11-08 DIAGNOSIS — O4103X1 Oligohydramnios, third trimester, fetus 1: Secondary | ICD-10-CM | POA: Diagnosis present

## 2017-11-08 DIAGNOSIS — O4103X0 Oligohydramnios, third trimester, not applicable or unspecified: Secondary | ICD-10-CM

## 2017-11-08 DIAGNOSIS — Z3A35 35 weeks gestation of pregnancy: Secondary | ICD-10-CM

## 2017-11-08 DIAGNOSIS — O480 Post-term pregnancy: Secondary | ICD-10-CM

## 2017-11-08 DIAGNOSIS — Z3A39 39 weeks gestation of pregnancy: Secondary | ICD-10-CM

## 2017-11-08 DIAGNOSIS — O894 Spinal and epidural anesthesia-induced headache during the puerperium: Secondary | ICD-10-CM | POA: Diagnosis not present

## 2017-11-08 DIAGNOSIS — Z8659 Personal history of other mental and behavioral disorders: Secondary | ICD-10-CM

## 2017-11-08 LAB — TYPE AND SCREEN
ABO/RH(D): O POS
ANTIBODY SCREEN: NEGATIVE

## 2017-11-08 LAB — CBC
HEMATOCRIT: 35.4 % — AB (ref 36.0–46.0)
HEMOGLOBIN: 11.9 g/dL — AB (ref 12.0–15.0)
MCH: 30.9 pg (ref 26.0–34.0)
MCHC: 33.6 g/dL (ref 30.0–36.0)
MCV: 91.9 fL (ref 80.0–100.0)
Platelets: 246 10*3/uL (ref 150–400)
RBC: 3.85 MIL/uL — ABNORMAL LOW (ref 3.87–5.11)
RDW: 14.8 % (ref 11.5–15.5)
WBC: 6.8 10*3/uL (ref 4.0–10.5)
nRBC: 0 % (ref 0.0–0.2)

## 2017-11-08 MED ORDER — METHYLERGONOVINE MALEATE 0.2 MG PO TABS: 0.2000 mg | ORAL_TABLET | ORAL | Status: DC | PRN

## 2017-11-08 MED ORDER — SIMETHICONE 80 MG PO CHEW: 80.0000 mg | CHEWABLE_TABLET | ORAL | Status: DC | PRN

## 2017-11-08 MED ORDER — AMMONIA AROMATIC IN INHA
RESPIRATORY_TRACT | Status: AC
  Filled 2017-11-08: qty 10

## 2017-11-08 MED ORDER — OXYTOCIN 40 UNITS IN LACTATED RINGERS INFUSION - SIMPLE MED
1.0000 m[IU]/min | INTRAVENOUS | Status: DC
  Administered 2017-11-08: 1 m[IU]/min via INTRAVENOUS
  Filled 2017-11-08: qty 1000

## 2017-11-08 MED ORDER — METHYLERGONOVINE MALEATE 0.2 MG/ML IJ SOLN: 0.2000 mg | INTRAMUSCULAR | Status: DC | PRN

## 2017-11-08 MED ORDER — SOD CITRATE-CITRIC ACID 500-334 MG/5ML PO SOLN: 30.0000 mL | ORAL | Status: DC | PRN

## 2017-11-08 MED ORDER — FERROUS SULFATE 325 (65 FE) MG PO TABS
325.0000 mg | ORAL_TABLET | Freq: Every day | ORAL | Status: DC
  Administered 2017-11-09 – 2017-11-10 (×2): 325 mg via ORAL
  Filled 2017-11-08 (×2): qty 1

## 2017-11-08 MED ORDER — OXYCODONE-ACETAMINOPHEN 5-325 MG PO TABS: 2.0000 | ORAL_TABLET | ORAL | Status: DC | PRN

## 2017-11-08 MED ORDER — ONDANSETRON HCL 4 MG PO TABS: 4.0000 mg | ORAL_TABLET | ORAL | Status: DC | PRN

## 2017-11-08 MED ORDER — BENZOCAINE-MENTHOL 20-0.5 % EX AERO: 1.0000 "application " | INHALATION_SPRAY | CUTANEOUS | Status: DC | PRN

## 2017-11-08 MED ORDER — TERBUTALINE SULFATE 1 MG/ML IJ SOLN: 0.2500 mg | Freq: Once | INTRAMUSCULAR | Status: DC | PRN

## 2017-11-08 MED ORDER — ONDANSETRON HCL 4 MG/2ML IJ SOLN: 4.0000 mg | Freq: Four times a day (QID) | INTRAMUSCULAR | Status: DC | PRN

## 2017-11-08 MED ORDER — FENTANYL 2.5 MCG/ML W/ROPIVACAINE 0.15% IN NS 100 ML EPIDURAL (ARMC)
EPIDURAL | Status: AC
  Filled 2017-11-08: qty 100

## 2017-11-08 MED ORDER — PRENATAL MULTIVITAMIN CH
1.0000 | ORAL_TABLET | Freq: Every day | ORAL | Status: DC
  Administered 2017-11-09: 1 via ORAL
  Filled 2017-11-08: qty 1

## 2017-11-08 MED ORDER — OXYCODONE-ACETAMINOPHEN 5-325 MG PO TABS: 1.0000 | ORAL_TABLET | ORAL | Status: DC | PRN

## 2017-11-08 MED ORDER — LACTATED RINGERS IV SOLN: 500.0000 mL | INTRAVENOUS | Status: DC | PRN

## 2017-11-08 MED ORDER — OXYTOCIN 10 UNIT/ML IJ SOLN
INTRAMUSCULAR | Status: AC
  Filled 2017-11-08: qty 2

## 2017-11-08 MED ORDER — DIBUCAINE 1 % RE OINT: 1.0000 "application " | TOPICAL_OINTMENT | RECTAL | Status: DC | PRN

## 2017-11-08 MED ORDER — ACETAMINOPHEN 325 MG PO TABS: 650.0000 mg | ORAL_TABLET | ORAL | Status: DC | PRN

## 2017-11-08 MED ORDER — LIDOCAINE HCL (PF) 1 % IJ SOLN
INTRAMUSCULAR | Status: AC
  Filled 2017-11-08: qty 30

## 2017-11-08 MED ORDER — LACTATED RINGERS IV SOLN: 500.0000 mL | Freq: Once | INTRAVENOUS | Status: DC

## 2017-11-08 MED ORDER — MISOPROSTOL 200 MCG PO TABS
ORAL_TABLET | ORAL | Status: AC
  Filled 2017-11-08: qty 4

## 2017-11-08 MED ORDER — LIDOCAINE HCL (PF) 1 % IJ SOLN
INTRAMUSCULAR | Status: DC | PRN
  Administered 2017-11-08: 3 mL

## 2017-11-08 MED ORDER — COCONUT OIL OIL
1.0000 "application " | TOPICAL_OIL | Status: DC | PRN
  Filled 2017-11-08: qty 120

## 2017-11-08 MED ORDER — TETANUS-DIPHTH-ACELL PERTUSSIS 5-2.5-18.5 LF-MCG/0.5 IM SUSP: 0.5000 mL | Freq: Once | INTRAMUSCULAR | Status: DC

## 2017-11-08 MED ORDER — LACTATED RINGERS IV SOLN
INTRAVENOUS | Status: DC
  Administered 2017-11-08: 11:00:00 via INTRAVENOUS

## 2017-11-08 MED ORDER — OXYTOCIN 40 UNITS IN LACTATED RINGERS INFUSION - SIMPLE MED: 2.5000 [IU]/h | INTRAVENOUS | Status: DC

## 2017-11-08 MED ORDER — DOCUSATE SODIUM 100 MG PO CAPS
100.0000 mg | ORAL_CAPSULE | Freq: Two times a day (BID) | ORAL | Status: DC
  Administered 2017-11-08 – 2017-11-09 (×2): 100 mg via ORAL
  Filled 2017-11-08 (×2): qty 1

## 2017-11-08 MED ORDER — WITCH HAZEL-GLYCERIN EX PADS: 1.0000 "application " | MEDICATED_PAD | CUTANEOUS | Status: DC | PRN

## 2017-11-08 MED ORDER — PHENYLEPHRINE 40 MCG/ML (10ML) SYRINGE FOR IV PUSH (FOR BLOOD PRESSURE SUPPORT)
80.0000 ug | PREFILLED_SYRINGE | INTRAVENOUS | Status: DC | PRN
  Filled 2017-11-08: qty 5

## 2017-11-08 MED ORDER — SENNOSIDES-DOCUSATE SODIUM 8.6-50 MG PO TABS: 2.0000 | ORAL_TABLET | ORAL | Status: DC

## 2017-11-08 MED ORDER — LIDOCAINE-EPINEPHRINE (PF) 1.5 %-1:200000 IJ SOLN
INTRAMUSCULAR | Status: DC | PRN
  Administered 2017-11-08: 3 mL via PERINEURAL

## 2017-11-08 MED ORDER — EPHEDRINE 5 MG/ML INJ
10.0000 mg | INTRAVENOUS | Status: DC | PRN
  Filled 2017-11-08: qty 2

## 2017-11-08 MED ORDER — OXYTOCIN BOLUS FROM INFUSION
500.0000 mL | Freq: Once | INTRAVENOUS | Status: AC
  Administered 2017-11-08: 500 mL via INTRAVENOUS

## 2017-11-08 MED ORDER — ACETAMINOPHEN 325 MG PO TABS
650.0000 mg | ORAL_TABLET | ORAL | Status: DC | PRN
  Administered 2017-11-10 (×2): 650 mg via ORAL
  Filled 2017-11-08 (×2): qty 2

## 2017-11-08 MED ORDER — BUPIVACAINE HCL (PF) 0.25 % IJ SOLN
INTRAMUSCULAR | Status: DC | PRN
  Administered 2017-11-08 (×2): 4 mL via EPIDURAL

## 2017-11-08 MED ORDER — DIPHENHYDRAMINE HCL 50 MG/ML IJ SOLN: 12.5000 mg | INTRAMUSCULAR | Status: DC | PRN

## 2017-11-08 MED ORDER — FENTANYL 2.5 MCG/ML W/ROPIVACAINE 0.15% IN NS 100 ML EPIDURAL (ARMC)
12.0000 mL/h | EPIDURAL | Status: DC
  Administered 2017-11-08: 12 mL/h via EPIDURAL

## 2017-11-08 MED ORDER — LIDOCAINE HCL (PF) 1 % IJ SOLN
30.0000 mL | INTRAMUSCULAR | Status: DC | PRN
  Filled 2017-11-08: qty 30

## 2017-11-08 MED ORDER — FENTANYL CITRATE (PF) 100 MCG/2ML IJ SOLN: 50.0000 ug | INTRAMUSCULAR | Status: DC | PRN

## 2017-11-08 MED ORDER — IBUPROFEN 600 MG PO TABS
600.0000 mg | ORAL_TABLET | Freq: Four times a day (QID) | ORAL | Status: DC
  Administered 2017-11-08 – 2017-11-10 (×8): 600 mg via ORAL
  Filled 2017-11-08 (×8): qty 1

## 2017-11-08 MED ORDER — ONDANSETRON HCL 4 MG/2ML IJ SOLN: 4.0000 mg | INTRAMUSCULAR | Status: DC | PRN

## 2017-11-08 NOTE — H&P (Signed)
History and Physical   HPI  Sheri Brooks is a 22 y.o. G2P1001 at [redacted]w[redacted]d Estimated Date of Delivery: 11/10/17 who is being admitted for induction of labor due to [facility] 4/8 in office.    OB History  OB History  Gravida Para Term Preterm AB Living  2 1 1  0 0 1  SAB TAB Ectopic Multiple Live Births  0 0 0 0 1    # Outcome Date GA Lbr Len/2nd Weight Sex Delivery Anes PTL Lv  2 Current           1 Term 2015    M Vag-Spont  N LIV    PROBLEM LIST  Pregnancy complications or risks: Patient Active Problem List   Diagnosis Date Noted  . Oligohydramnios antepartum, third trimester, fetus 1 11/08/2017  . History of depression 04/18/2013    Prenatal labs and studies: ABO, Rh: --/--/PENDING (11/06 1037) Antibody: PENDING (11/06 1037) Rubella: 1.57 (06/10 1408) RPR: Non Reactive (08/20 1002)  HBsAg: Negative (06/10 1408)  HIV: Non Reactive (06/10 1408)  [id number] (10/10 1158)   History reviewed. No pertinent past medical history.   History reviewed. No pertinent surgical history.   Medications    Current Discharge Medication List    CONTINUE these medications which have NOT CHANGED   Details  Prenat-Fe Poly-Methfol-FA-DHA (VITAFOL ULTRA) 29-0.6-0.4-200 MG CAPS Take 1 tablet by mouth daily. Qty: 30 capsule, Refills: 11         Allergies  Patient has no known allergies.  Review of Systems  Constitutional: negative Eyes: negative Ears, nose, mouth, throat, and face: negative Respiratory: negative Cardiovascular: negative Gastrointestinal: negative Genitourinary:negative Integument/breast: negative Hematologic/lymphatic: negative Musculoskeletal:negative Neurological: negative Behavioral/Psych: negative Endocrine: negative Allergic/Immunologic: negative  Physical Exam  BP 107/78   Pulse (!) 101   Temp 98.4 F (36.9 C) (Oral)   Resp 14   Ht 5' (1.524 m)   Wt 81.5 kg   LMP 01/25/2017 (Approximate)   BMI 35.09 kg/m   Lungs:  CTA  B Cardio: RRR without M/R/G Abd: Soft, gravid, NT Presentation: cephalic EXT: No C/C/ 1+ Edema DTRs: 2+ B CERVIX:  Deferred, done in office  See Prenatal records for more detailed PE.     FHR:  Baseline: 140 bpm, Variability: Good {> 6 bpm), Accelerations: Reactive and Decelerations: Absent  Toco: Uterine Contractions: Frequency: Every 2-3 minutes, Duration: 40-60 seconds and Intensity: mild  Test Results  Results for orders placed or performed during the hospital encounter of 11/08/17 (from the past 24 hour(s))  CBC     Status: Abnormal   Collection Time: 11/08/17 10:36 AM  Result Value Ref Range   WBC 6.8 4.0 - 10.5 K/uL   RBC 3.85 (L) 3.87 - 5.11 MIL/uL   Hemoglobin 11.9 (L) 12.0 - 15.0 g/dL   HCT 98.1 (L) 19.1 - 47.8 %   MCV 91.9 80.0 - 100.0 fL   MCH 30.9 26.0 - 34.0 pg   MCHC 33.6 30.0 - 36.0 g/dL   RDW 29.5 62.1 - 30.8 %   Platelets 246 150 - 400 K/uL   nRBC 0.0 0.0 - 0.2 %  Type and screen     Status: None (Preliminary result)   Collection Time: 11/08/17 10:37 AM  Result Value Ref Range   ABO/RH(D) PENDING    Antibody Screen PENDING    Sample Expiration      11/11/2017 Performed at [facility], [address] [place], [place] [address]    Group B Strep negative  Assessment  G2P1001 at [redacted]w[redacted]d Estimated Date of Delivery: 11/10/17  The fetus is reassuring.    Patient Active Problem List   Diagnosis Date Noted  . Oligohydramnios antepartum, third trimester, fetus 1 11/08/2017  . History of depression 04/18/2013    Plan  1. Admit to L&D :   IV Pitocin induction 2. EFM:-- Category 1 3. Epidural if desired.  Stadol for IV pain until epidural requested. 4. Admission labs  5. Anticipate NSVD  Doreene Burke, CNM  11/08/2017 11:44 AM

## 2017-11-08 NOTE — Anesthesia Procedure Notes (Signed)
Epidural Patient location during procedure: OB Start time: 11/08/2017 3:35 PM End time: 11/08/2017 3:41 PM  Staffing Anesthesiologist: Piscitello, Cleda Mccreedy, MD Resident/CRNA: Stormy Fabian, CRNA Performed: resident/CRNA   Preanesthetic Checklist Completed: patient identified, site marked, surgical consent, pre-op evaluation, timeout performed, IV checked, risks and benefits discussed and monitors and equipment checked  Epidural Patient position: sitting Prep: ChloraPrep Patient monitoring: heart rate, continuous pulse ox and blood pressure Approach: midline Location: L4-L5 Injection technique: LOR saline  Needle:  Needle type: Tuohy  Needle gauge: 17 G Needle length: 9 cm and 9 Needle insertion depth: 7 cm Catheter type: closed end flexible Catheter size: 19 Gauge Catheter at skin depth: 11 cm Test dose: negative and 1.5% lidocaine with Epi 1:200 K  Assessment Sensory level: T10 Events: blood not aspirated, injection not painful, no injection resistance, negative IV test and no paresthesia  Additional Notes 2 attempt Pt. Evaluated and documentation done after procedure finished. Patient identified. Risks/Benefits/Options discussed with patient including but not limited to bleeding, infection, nerve damage, paralysis, failed block, incomplete pain control, headache, blood pressure changes, nausea, vomiting, reactions to medication both or allergic, itching and postpartum back pain. Confirmed with bedside nurse the patient's most recent platelet count. Confirmed with patient that they are not currently taking any anticoagulation, have any bleeding history or any family history of bleeding disorders. Patient expressed understanding and wished to proceed. All questions were answered. Sterile technique was used throughout the entire procedure. Please see nursing notes for vital signs. Test dose was given through epidural catheter and negative prior to continuing to dose epidural or  start infusion. Warning signs of high block given to the patient including shortness of breath, tingling/numbness in hands, complete motor block, or any concerning symptoms with instructions to call for help. Patient was given instructions on fall risk and not to get out of bed. All questions and concerns addressed with instructions to call with any issues or inadequate analgesia.   Patient tolerated the insertion well without immediate complications.Reason for block:procedure for pain

## 2017-11-08 NOTE — Anesthesia Preprocedure Evaluation (Signed)
Anesthesia Evaluation  Patient identified by MRN, date of birth, ID band Patient awake    Reviewed: Allergy & Precautions, H&P , NPO status , Patient's Chart, lab work & pertinent test results  History of Anesthesia Complications Negative for: history of anesthetic complications  Airway Mallampati: II  TM Distance: >3 FB Neck ROM: full    Dental no notable dental hx.    Pulmonary    Pulmonary exam normal        Cardiovascular negative cardio ROS Normal cardiovascular exam     Neuro/Psych negative neurological ROS  negative psych ROS   GI/Hepatic negative GI ROS, Neg liver ROS,   Endo/Other  negative endocrine ROS  Renal/GU negative Renal ROS  negative genitourinary   Musculoskeletal   Abdominal   Peds  Hematology negative hematology ROS (+)   Anesthesia Other Findings   Reproductive/Obstetrics (+) Pregnancy                             Anesthesia Physical Anesthesia Plan  ASA: II  Anesthesia Plan: Epidural   Post-op Pain Management:    Induction:   PONV Risk Score and Plan:   Airway Management Planned:   Additional Equipment:   Intra-op Plan:   Post-operative Plan:   Informed Consent: I have reviewed the patients History and Physical, chart, labs and discussed the procedure including the risks, benefits and alternatives for the proposed anesthesia with the patient or authorized representative who has indicated his/her understanding and acceptance.     Plan Discussed with: Anesthesiologist  Anesthesia Plan Comments:         Anesthesia Quick Evaluation

## 2017-11-08 NOTE — Progress Notes (Signed)
ROB and BPP- sent to hospital for IOL due to low BPP and oligohydramnious, counseled at length regarding pitocin IOL and increased risks for cesarean delivery due to fetal distress.   Ultrasound reveals: Indications: BPP & Growth Findings:  Singleton intrauterine pregnancy is visualized with FHR at 147 BPM. Biometrics give an (U/S) Gestational age of 24 3/7 weeks and an (U/S) EDD of 12/10/17; this DOES NOT correlate with the clinically established EDD of 11/10/17.  Fetal presentation is vertex.  EFW: 2628 grams (5lb 13oz).  2nd percentile.  All biometric measurements range from 35 weeks to 35 5/7 weeks. Placenta: Anterior and grade 2-3. AFI: Extreme oligohydramnios at 2.5 cm.  Only 2 pockets with any measurable fluid.  BPP: 4/8 Fetal movement and tone were identified.  Exam ended short due to extreme oligohydramnios and no fetal breathing.  Umbilical cord was identified at the back of baby's neck, however, a nuchal cord can not be proven or disproven.  Fetal stomach, kidneys, and bladder appear WNL.  Impression: 1. 35 3/7 week Viable Singleton Intrauterine pregnancy by U/S. 2. (U/S) EDD IS NOT consistent with Clinically established (LMP) EDD of 11/10/17. 3. EFW: 2628 grams (5lb 13oz).  2nd percentile.  Please see comments above. 4. BPP: 4/8 - No breathing and oligohydramnios 5. Nuchal cord can not be ruled out by this exam.

## 2017-11-08 NOTE — Progress Notes (Signed)
LABOR NOTE   Sheri Brooks 22 y.o.@ at [redacted]w[redacted]d Active phase labor.  SUBJECTIVE:  Requesting pain medication OBJECTIVE:  BP (!) 96/56   Pulse (!) 103   Temp 98.4 F (36.9 C) (Oral)   Resp 18   Ht 5' (1.524 m)   Wt 81.5 kg   LMP 01/25/2017 (Approximate)   BMI 35.09 kg/m  Total I/O In: 302.3 [I.V.:302.3] Out: -   She has shown cervical change. CERVIX: 5cm:  100%:   -2:   mid position:   soft SVE:   Dilation: 5 Effacement (%): 100 Station: -2 Exam by:: A Michaelah Credeur,CNM CONTRACTIONS: irregular, every 2-3 minutes FHR: Fetal heart tracing reviewed. Baseline: 145 bpm, Variability: Good {> 6 bpm), Accelerations: Reactive and Decelerations: Absent Category I   Analgesia: planning epidural  Labs: Lab Results  Component Value Date   WBC 6.8 11/08/2017   HGB 11.9 (L) 11/08/2017   HCT 35.4 (L) 11/08/2017   MCV 91.9 11/08/2017   PLT 246 11/08/2017    ASSESSMENT: 1) Labor curve reviewed.       Progress: Active phase labor.     Membranes: ruptured, SROM clear        Active Problems:   Oligohydramnios antepartum, third trimester, fetus 1   PLAN: continue present management  Doreene Burke, CNM  11/08/2017 3:17 PM

## 2017-11-08 NOTE — Progress Notes (Signed)
ROB- BPP done today, pt is having pelvic pressure

## 2017-11-09 LAB — CBC
HEMATOCRIT: 34.5 % — AB (ref 36.0–46.0)
Hemoglobin: 11.3 g/dL — ABNORMAL LOW (ref 12.0–15.0)
MCH: 30.9 pg (ref 26.0–34.0)
MCHC: 32.8 g/dL (ref 30.0–36.0)
MCV: 94.3 fL (ref 80.0–100.0)
NRBC: 0 % (ref 0.0–0.2)
PLATELETS: 231 10*3/uL (ref 150–400)
RBC: 3.66 MIL/uL — ABNORMAL LOW (ref 3.87–5.11)
RDW: 14.8 % (ref 11.5–15.5)
WBC: 10.3 10*3/uL (ref 4.0–10.5)

## 2017-11-09 LAB — RPR: RPR: NONREACTIVE

## 2017-11-09 MED ORDER — DOCUSATE SODIUM 100 MG PO CAPS: 100.0000 mg | ORAL_CAPSULE | Freq: Two times a day (BID) | ORAL | 0 refills | Status: DC

## 2017-11-09 MED ORDER — IBUPROFEN 600 MG PO TABS: 600.0000 mg | ORAL_TABLET | Freq: Four times a day (QID) | ORAL | 0 refills | Status: AC

## 2017-11-09 NOTE — Final Progress Note (Signed)
Discharge Day SOAP Note:  Progress Note - Vaginal Delivery  Sheri Brooks is a 22 y.o. [phone] now PP day 1 s/p Vaginal, Spontaneous . Delivery was complicated by nuchal cord x1 and true knot.  Subjective  The patient has the following complaints: has no unusual complaints  Pain is controlled with current medications.   Patient is urinating without difficulty.  She is ambulating well.     Objective  Vital signs: BP 110/84 (BP Location: Left Arm)   Pulse 97   Temp 98.2 F (36.8 C) (Oral)   Resp 16   Ht 5' (1.524 m)   Wt 81.5 kg   LMP 01/25/2017 (Approximate)   SpO2 100%   Breastfeeding? Unknown   BMI 35.09 kg/m   Physical Exam: Gen: NAD Lungs clear bilaterally  Heart: RRR Fundus Fundal Tone: Firm@ u-1  Lochia Amount: Small  Perineum Appearance: Intact     Data Review Labs: CBC Latest Ref Rng & Units 11/09/2017 11/08/2017 08/22/2017  WBC 4.0 - 10.5 K/uL 10.3 6.8 5.8  Hemoglobin 12.0 - 15.0 g/dL 11.3(L) 11.9(L) 10.6(L)  Hematocrit 36.0 - 46.0 % 34.5(L) 35.4(L) 32.1(L)  Platelets 150 - 400 K/uL 231 246 227   O POS  Assessment/Plan  Active Problems:   Oligohydramnios antepartum, third trimester, fetus 1    Plan for discharge today.   Discharge Instructions: Per After Visit Summary. Activity: Advance as tolerated. Pelvic rest for 6 weeks.  Also refer to After Visit Summary Diet: Regular Medications: Allergies as of 11/09/2017   No Known Allergies     Medication List    TAKE these medications   docusate sodium 100 MG capsule Commonly known as:  COLACE Take 1 capsule (100 mg total) by mouth 2 (two) times daily.   ibuprofen 600 MG tablet Commonly known as:  ADVIL,MOTRIN Take 1 tablet (600 mg total) by mouth every 6 (six) hours.   VITAFOL ULTRA 29-0.6-0.4-200 MG Caps Take 1 tablet by mouth daily.      Outpatient follow up:  6 wks @ [facility] With Doreene Burke, CNM  Postpartum contraception: Plans Para Gaurd  Discharged Condition:  good  Discharged to: home  Newborn Data: Disposition:home with mother  Apgars: APGAR (1 MIN): 8   APGAR (5 MINS): 9   APGAR (10 MINS):    Baby Feeding: Breast    Doreene Burke, CNM  11/09/2017 8:20 AM

## 2017-11-09 NOTE — Discharge Summary (Signed)
Discharge Summary  Date of Admission: 11/08/2017  Date of Discharge: 11/09/2017  Admitting Diagnosis: Induction of labor at [redacted]w[redacted]d, oligohydramnios   Mode of Delivery: normal spontaneous vaginal delivery                 Discharge Diagnosis: No other diagnosis   Intrapartum Procedures: epidural and pitocin augmentation   Post partum procedures: none  Complications: none                      Discharge Day SOAP Note:  Progress Note - Vaginal Delivery  Sheri Brooks is a 22 y.o. G2P2002 now PP day 1 s/p Vaginal, Spontaneous . Delivery was complicated by nuchal cord x1 and true knot.  Subjective  The patient has the following complaints: has no unusual complaints  Pain is controlled with current medications.   Patient is urinating without difficulty.  She is ambulating well.     Objective  Vital signs: BP 110/84 (BP Location: Left Arm)   Pulse 97   Temp 98.2 F (36.8 C) (Oral)   Resp 16   Ht 5' (1.524 m)   Wt 81.5 kg   LMP 01/25/2017 (Approximate)   SpO2 100%   Breastfeeding? Unknown   BMI 35.09 kg/m   Physical Exam: Gen: NAD Lungs clear bilaterally  Heart: RRR Fundus Fundal Tone: Firm@ u-1  Lochia Amount: Small  Perineum Appearance: Intact     Data Review Labs: CBC Latest Ref Rng & Units 11/09/2017 11/08/2017 08/22/2017  WBC 4.0 - 10.5 K/uL 10.3 6.8 5.8  Hemoglobin 12.0 - 15.0 g/dL 11.3(L) 11.9(L) 10.6(L)  Hematocrit 36.0 - 46.0 % 34.5(L) 35.4(L) 32.1(L)  Platelets 150 - 400 K/uL 231 246 227   O POS  Assessment/Plan  Active Problems:   Oligohydramnios antepartum, third trimester, fetus 1    Plan for discharge today.   Discharge Instructions: Per After Visit Summary. Activity: Advance as tolerated. Pelvic rest for 6 weeks.  Also refer to After Visit Summary Diet: Regular Medications: Allergies as of 11/09/2017   No Known Allergies     Medication List    TAKE these medications   docusate sodium 100 MG  capsule Commonly known as:  COLACE Take 1 capsule (100 mg total) by mouth 2 (two) times daily.   ibuprofen 600 MG tablet Commonly known as:  ADVIL,MOTRIN Take 1 tablet (600 mg total) by mouth every 6 (six) hours.   VITAFOL ULTRA 29-0.6-0.4-200 MG Caps Take 1 tablet by mouth daily.      Outpatient follow up:  6 wks @ [facility] With Doreene Burke, CNM  Postpartum contraception: Plans Para Gaurd  Discharged Condition: good  Discharged to: home  Newborn Data: Disposition:home with mother  Apgars: APGAR (1 MIN): 8   APGAR (5 MINS): 9   APGAR (10 MINS):    Baby Feeding: Breast    Doreene Burke, CNM  11/09/2017 8:20 AM

## 2017-11-09 NOTE — Lactation Note (Signed)
This note was copied from a baby's chart. Lactation Consultation Note  Patient Name: Sheri Brooks Today's Date: 11/09/2017 Reason for consult: Initial assessment   Maternal Data Has patient been taught Hand Expression?: Yes Does the patient have breastfeeding experience prior to this delivery?: Yes  Feeding Feeding Type: Bottle Fed - Formula(Attempted breast, infant could not latch well) Nipple Type: Slow - flow  LATCH Score Latch: Too sleepy or reluctant, no latch achieved, no sucking elicited.  Audible Swallowing: None  Type of Nipple: Everted at rest and after stimulation  Comfort (Breast/Nipple): Soft / non-tender  Hold (Positioning): Assistance needed to correctly position infant at breast and maintain latch.  LATCH Score: 5  Interventions Interventions: DEBP  Lactation Tools Discussed/Used Pump Review: Setup, frequency, and cleaning Initiated by:: Alicia Lilly, RN IBCLC Date initiated:: 11/09/17   Consult Status Consult Status: Follow-up Date: 11/09/17 Follow-up type: In-patient  Mother states that infant has not been latching well and will not open mouth wide enough for a deep latch. LC attempted to assist by placing infant in several positions and removing clothes in order to wake infant. At times, infant could not achieve a deep latch and then once latched would not suck.  Mother states that she gave infant a bottle during the night because she is not producing milk. LC discussed with mother about colostrum production versus mature milk, skin to skin and taught mother how to hand express. LC also explained to mother the difference between latching on the breast and to continue to practice latching with infant. Mother states that she is going to back to work next Thursday, she has a Medela electric pump at home and has questions on pumping. LC provided mother with a pump kit and explained how to use the pump. Mother pumped out 15 mL of milk. LC encouraged  mother to continue to offer the breast first, supplement if needed and pump afterwards if breastfeeding is not effective.    Alicia Lilly 11/09/2017, 1:56 PM    

## 2017-11-09 NOTE — Anesthesia Postprocedure Evaluation (Signed)
Anesthesia Post Note  Patient: Sheri Brooks  Procedure(s) Performed: AN AD HOC LABOR EPIDURAL  Patient location during evaluation: Mother Baby Anesthesia Type: Epidural Level of consciousness: awake and alert and oriented Pain management: pain level controlled Vital Signs Assessment: post-procedure vital signs reviewed and stable Respiratory status: respiratory function stable Cardiovascular status: stable Postop Assessment: no headache, no backache, epidural receding, patient able to bend at knees, no apparent nausea or vomiting and adequate PO intake Anesthetic complications: no     Last Vitals:  Vitals:   11/08/17 2319 11/09/17 0328  BP: 109/66 121/90  Pulse: (!) 110 (!) 102  Resp: 16 16  Temp: 37.1 C 36.7 C  SpO2: 99% 98%    Last Pain:  Vitals:   11/09/17 0328  TempSrc: Oral  PainSc:                  Clydene Pugh

## 2017-11-10 ENCOUNTER — Ambulatory Visit: Payer: Self-pay

## 2017-11-10 ENCOUNTER — Encounter: Payer: Self-pay | Admitting: Anesthesiology

## 2017-11-10 MED ORDER — LACTATED RINGERS IV BOLUS
1000.0000 mL | Freq: Once | INTRAVENOUS | Status: AC
  Administered 2017-11-10: 1000 mL via INTRAVENOUS

## 2017-11-10 NOTE — Progress Notes (Signed)
pt discharged home with infant.  Discharge instructions, prescriptions and follow up appointment given to and reviewed with pt.  Pt verbalized understanding, all questions answered.  Escorted by auxiliary. 

## 2017-11-10 NOTE — Progress Notes (Signed)
Patient returned to room via bed by PACU RN at 1200 on 11/10/17. Reynold Bowen, RN 11/10/2017 4:34 PM

## 2017-11-10 NOTE — Progress Notes (Addendum)
Patient transported to PACU for Blood Patch at 0935 on 11/10/17 via bed by RN. Reynold Bowen, RN 11/10/2017 4:33 PM

## 2017-11-10 NOTE — Lactation Note (Addendum)
This note was copied from a baby's chart. Lactation Consultation Note  Patient Name: Sheri Brooks [id number] Date: 11/10/2017 Reason for consult: Follow-up assessment;Primapara BAby sleepy, mom pumping, assisted mom with latching baby in side lying position, sleepy at breast but responded to stimulation, mom shown how to get large nipple in baby's small mouth by shaping breast and supporting breast while baby is nursing.  Mom pumping to supplement after nursing   Obtained 15 cc from both breasts and mom fed this to baby by bottle with slow flow nipple Maternal Data Formula Feeding for Exclusion: No Does the patient have breastfeeding experience prior to this delivery?: Yes  Feeding Feeding Type: Breast Fed  LATCH Score Latch: Repeated attempts needed to sustain latch, nipple held in mouth throughout feeding, stimulation needed to elicit sucking reflex.  Audible Swallowing: A few with stimulation  Type of Nipple: Everted at rest and after stimulation(large nipples)  Comfort (Breast/Nipple): Filling, red/small blisters or bruises, mild/mod discomfort  Hold (Positioning): Assistance needed to correctly position infant at breast and maintain latch.  LATCH Score: 6  Interventions Interventions: Assisted with latch;Breast massage;Hand express;Pre-pump if needed;Breast compression;Support pillows;Position options;Hand pump;DEBP  Lactation Tools Discussed/Used     Consult Status Consult Status: PRN Date: 11/10/17 Follow-up type: In-patient    Dyann Kief 11/10/2017, 2:20 PM

## 2017-11-10 NOTE — Discharge Instructions (Signed)
No strenuous activity or heavy lifting for 6 weeks.  No intercourse, tampons, or douching for 6 weeks.  No tub baths- showers only.  Increase calories and fluids while breastfeeding. Continue parental vitamin and iron.  Call your doctor for increased pain or vaginal bleeding, temperature above 100.4, depression, or concerns.

## 2017-11-10 NOTE — Progress Notes (Signed)
Pt. Arrived in PACU to receive blood patch , positioned in sitting position blood patch adm. By Dr. Priscella Mann . C/o heaviness in buttucks after procedure , postion flat on back heaviness gone.

## 2017-11-10 NOTE — Progress Notes (Signed)
Called by midwife to evaluate HA.   Pt notes positional HA that is worse when sitting and standing, resolves with lying flat. Describes throbbing from front of head to back of neck, denies vision changes or auditory symptoms, no N/V, no fevers.   Discussed case with anesthesia provider who placed epidural and they report that it was uncomplicated placement, no known dural puncture, however symptoms are consistent with PDPH.  The natural progression is for spontaneous resolution of the headache, however we are unable to predict the time course to resolution. Conservative therapy has been ineffective. Given that symptoms are debilitating I have recommended proceeding with an epidural blood patch. I discussed that this treatment is effective in ~60-70% of cases and that we would expect to see resolution of symptoms within a few hours. We discussed that in cases where the first blood patch is ineffective a second blood patch could be considered in the next 24-48 hours. We discussed that even after resolution the headache could return which could be treated with a second epidural blood patch. We discussed that in a small percentage of cases epidural blood patch is ineffective. We also discussed that in a very small percentage of patients PDPH can lead to development of chronic headache. Risks and benefits of epidural blood patch were discussed and patient would like to proceed. Please see procedure note for details of the procedure.

## 2017-11-10 NOTE — Progress Notes (Signed)
Post Partum Day 2 Subjective: C/o headache when she gets up, states it goes away when she lays down, not relieved with tylenol or motrin. States her neck hurts and   Objective: Blood pressure 103/65, pulse 74, temperature 98.2 F (36.8 C), temperature source Oral, resp. rate 16, height 5' (1.524 m), weight 81.5 kg, last menstrual period 01/25/2017, SpO2 100 %, unknown if currently breastfeeding.  Physical Exam:  General: alert, cooperative and appears stated age Lochia: appropriate Uterine Fundus: firm DVT Evaluation: No evidence of DVT seen on physical exam. Negative Homan's sign.  Recent Labs    11/08/17 1036 11/09/17 0444  HGB 11.9* 11.3*  HCT 35.4* 34.5*    Assessment/Plan: Possible spinal headache- will have anesthesia evaluate Infant feeding  Both, and under bili-lights.    LOS: 2 days   Narcisa Ganesh N Genisis Sonnier,CNM 11/10/2017, 8:15 AM

## 2017-11-14 ENCOUNTER — Telehealth: Payer: Self-pay

## 2017-11-14 ENCOUNTER — Telehealth: Payer: Self-pay | Admitting: Certified Nurse Midwife

## 2017-11-14 NOTE — Telephone Encounter (Signed)
The patient states she has had a persistent headache since delivery.  She did have an epidural and blood patch, per patient.  She is asking to speak with her nurse or Pattricia Bossnnie and is asking for pain relief, please advise, thanks.

## 2017-11-14 NOTE — Telephone Encounter (Signed)
Pt states she tried Advil but not relief. AT consulted with [facility] who suggested pt try caffeine- coffee, tea, soda etc along with tylenol. Pt was agreeable. Also requested pt reach back out if she gets no relief- referral to anesthesiology will be placed.

## 2018-01-09 ENCOUNTER — Encounter: Payer: Self-pay | Admitting: Certified Nurse Midwife

## 2018-01-09 ENCOUNTER — Ambulatory Visit (INDEPENDENT_AMBULATORY_CARE_PROVIDER_SITE_OTHER): Payer: Medicaid Other | Admitting: Certified Nurse Midwife

## 2018-01-09 DIAGNOSIS — Z3202 Encounter for pregnancy test, result negative: Secondary | ICD-10-CM

## 2018-01-09 DIAGNOSIS — Z3049 Encounter for surveillance of other contraceptives: Secondary | ICD-10-CM

## 2018-01-09 DIAGNOSIS — Z30017 Encounter for initial prescription of implantable subdermal contraceptive: Secondary | ICD-10-CM

## 2018-01-09 LAB — POCT URINE PREGNANCY: Preg Test, Ur: NEGATIVE

## 2018-01-09 NOTE — Progress Notes (Signed)
Subjective:    Sheri Brooks is a 23 y.o. G59P2002 Hispanic female who presents for a postpartum visit. She is 7 weeks postpartum following a spontaneous vaginal delivery at 39.5 gestational weeks. Anesthesia: epidural. I have fully reviewed the prenatal and intrapartum course. Postpartum course has been WNL. Baby's course has been WNL. Baby is feeding by breast and bottle . Bleeding no bleeding. Bowel function is normal. Bladder function is normal. Patient is not sexually active. Last sexual activity: prior to delivery. Contraception method is none. Postpartum depression screening: negative. Score 5.  Last pap 05/15/17 and was negative.  The following portions of the patient's history were reviewed and updated as appropriate: allergies, current medications, past medical history, past surgical history and problem list.  Review of Systems Pertinent items are noted in HPI.   There were no vitals filed for this visit. No LMP recorded.  Objective:   General:  alert, cooperative and no distress   Breasts:  deferred, no complaints  Lungs: clear to auscultation bilaterally  Heart:  regular rate and rhythm  Abdomen: soft, nontender   Vulva: normal  Vagina: normal vagina  Cervix:  closed  Corpus: Well-involuted  Adnexa:  Non-palpable  Rectal Exam: No  hemorrhoids        Assessment:   Postpartum exam 7 wks s/p SVD Breast and bottle feeding Depression screening Contraception counseling   Plan:  : none, wants nexplanon.  Follow up in: 1 year for annual or earlier if needed  Doreene Burke, CNM

## 2018-01-09 NOTE — Progress Notes (Signed)
[name] is a 23 y.o. year old Hispanic female here for Nexplanon insertion.  No LMP recorded (lmp unknown)., last sexual intercourse was prior to delivery, and her pregnancy test today was negative.  Risks/benefits/side effects of Nexplanon have been discussed and her questions have been answered.  Specifically, a failure rate of 01/998 has been reported, with an increased failure rate if pt takes St. John's Wort and/or antiseizure medicaitons.  Hughie Closs Trula Bressette is aware of the common side effect of irregular bleeding, which the incidence of decreases over time.  BP 102/71   Pulse 95   Ht 5' (1.524 m)   Wt 164 lb 8 oz (74.6 kg)   LMP  (LMP Unknown)   Breastfeeding Yes   BMI 32.13 kg/m   Results for orders placed or performed in visit on 01/09/18 (from the past 24 hour(s))  POCT urine pregnancy   Collection Time: 01/09/18 10:20 AM  Result Value Ref Range   Preg Test, Ur Negative Negative     She is right-handed, so her left arm, approximately 4 inches proximal from the elbow, was cleansed with alcohol and anesthetized with 2cc of 2% Lidocaine.  The area was cleansed again with betadine and the Nexplanon was inserted per manufacturer's recommendations without difficulty.  A steri-strip and pressure bandage were applied.  Pt was instructed to keep the area clean and dry, remove pressure bandage in 24 hours, and keep insertion site covered with the steri-strip for 3-5 days.  Back up contraception was recommended for 2 weeks.  She was given a card indicating date Nexplanon was inserted and date it needs to be removed. Follow-up PRN problems.  Pattricia Boss [place],CNM

## 2018-01-09 NOTE — Patient Instructions (Signed)
Preventive Care 18-39 Years, Female Preventive care refers to lifestyle choices and visits with your health care provider that can promote health and wellness. What does preventive care include?   A yearly physical exam. This is also called an annual well check.  Dental exams once or twice a year.  Routine eye exams. Ask your health care provider how often you should have your eyes checked.  Personal lifestyle choices, including: ? Daily care of your teeth and gums. ? Regular physical activity. ? Eating a healthy diet. ? Avoiding tobacco and drug use. ? Limiting alcohol use. ? Practicing safe sex. ? Taking vitamin and mineral supplements as recommended by your health care provider. What happens during an annual well check? The services and screenings done by your health care provider during your annual well check will depend on your age, overall health, lifestyle risk factors, and family history of disease. Counseling Your health care provider may ask you questions about your:  Alcohol use.  Tobacco use.  Drug use.  Emotional well-being.  Home and relationship well-being.  Sexual activity.  Eating habits.  Work and work environment.  Method of birth control.  Menstrual cycle.  Pregnancy history. Screening You may have the following tests or measurements:  Height, weight, and BMI.  Diabetes screening. This is done by checking your blood sugar (glucose) after you have not eaten for a while (fasting).  Blood pressure.  Lipid and cholesterol levels. These may be checked every 5 years starting at age 20.  Skin check.  Hepatitis C blood test.  Hepatitis B blood test.  Sexually transmitted disease (STD) testing.  BRCA-related cancer screening. This may be done if you have a family history of breast, ovarian, tubal, or peritoneal cancers.  Pelvic exam and Pap test. This may be done every 3 years starting at age 21. Starting at age 30, this may be done every 5  years if you have a Pap test in combination with an HPV test. Discuss your test results, treatment options, and if necessary, the need for more tests with your health care provider. Vaccines Your health care provider may recommend certain vaccines, such as:  Influenza vaccine. This is recommended every year.  Tetanus, diphtheria, and acellular pertussis (Tdap, Td) vaccine. You may need a Td booster every 10 years.  Varicella vaccine. You may need this if you have not been vaccinated.  HPV vaccine. If you are 26 or younger, you may need three doses over 6 months.  Measles, mumps, and rubella (MMR) vaccine. You may need at least one dose of MMR. You may also need a second dose.  Pneumococcal 13-valent conjugate (PCV13) vaccine. You may need this if you have certain conditions and were not previously vaccinated.  Pneumococcal polysaccharide (PPSV23) vaccine. You may need one or two doses if you smoke cigarettes or if you have certain conditions.  Meningococcal vaccine. One dose is recommended if you are age 19-21 years and a first-year college student living in a residence hall, or if you have one of several medical conditions. You may also need additional booster doses.  Hepatitis A vaccine. You may need this if you have certain conditions or if you travel or work in places where you may be exposed to hepatitis A.  Hepatitis B vaccine. You may need this if you have certain conditions or if you travel or work in places where you may be exposed to hepatitis B.  Haemophilus influenzae type b (Hib) vaccine. You may need this if you   have certain risk factors. Talk to your health care provider about which screenings and vaccines you need and how often you need them. This information is not intended to replace advice given to you by your health care provider. Make sure you discuss any questions you have with your health care provider. Document Released: 02/15/2001 Document Revised: 08/02/2016  Document Reviewed: 10/21/2014 Elsevier Interactive Patient Education  2019 [employer].

## 2018-03-13 ENCOUNTER — Emergency Department
Admission: EM | Admit: 2018-03-13 | Discharge: 2018-03-13 | Disposition: A | Discharge status: Home / Self Care | Payer: Medicaid Other | Attending: Emergency Medicine | Admitting: Emergency Medicine

## 2018-03-13 ENCOUNTER — Encounter: Payer: Self-pay | Admitting: Emergency Medicine

## 2018-03-13 ENCOUNTER — Other Ambulatory Visit: Payer: Self-pay

## 2018-03-13 DIAGNOSIS — R51 Headache: Secondary | ICD-10-CM | POA: Diagnosis not present

## 2018-03-13 DIAGNOSIS — R110 Nausea: Secondary | ICD-10-CM | POA: Diagnosis not present

## 2018-03-13 DIAGNOSIS — R531 Weakness: Secondary | ICD-10-CM | POA: Insufficient documentation

## 2018-03-13 DIAGNOSIS — Z79899 Other long term (current) drug therapy: Secondary | ICD-10-CM | POA: Insufficient documentation

## 2018-03-13 NOTE — ED Provider Notes (Signed)
[facility] Emergency Department Provider Note   ____________________________________________    I have reviewed the triage vital signs and the nursing notes.   HISTORY  Chief Complaint Weakness    HPI Sheri Brooks is a 23 y.o. female who presents for evaluation.  Patient reports that yesterday evening after eating she developed nausea and felt weak and thought she might have a fever.  She reports she had to lay in bed but did not vomit.  No diarrhea.  No recent travel.  Today she feels better, had a mild headache earlier today which has resolved.  Currently feels well.  L no sick contacts.  History reviewed. No pertinent past medical history.  Patient Active Problem List   Diagnosis Date Noted  . Oligohydramnios antepartum, third trimester, fetus 1 11/08/2017  . History of depression 04/18/2013    History reviewed. No pertinent surgical history.  Prior to Admission medications   Medication Sig Start Date End Date Taking? Authorizing Provider  docusate sodium (COLACE) 100 MG capsule Take 1 capsule (100 mg total) by mouth 2 (two) times daily. 11/09/17   Doreene Burke, CNM  ibuprofen (ADVIL,MOTRIN) 600 MG tablet Take 1 tablet (600 mg total) by mouth every 6 (six) hours. Patient not taking: Reported on 01/09/2018 11/09/17   Doreene Burke, CNM  Prenat-Fe Poly-Methfol-FA-DHA (VITAFOL ULTRA) 29-0.6-0.4-200 MG CAPS Take 1 tablet by mouth daily. 10/12/17   Gunnar Bulla, CNM     Allergies Patient has no known allergies.  History reviewed. No pertinent family history.  Social History Social History   Tobacco Use  . Smoking status: Never Smoker  . Smokeless tobacco: Never Used  Substance Use Topics  . Alcohol use: No  . Drug use: No    Review of Systems  Constitutional: As above  ENT: No sore throat.   Gastrointestinal: No abdominal pain.  As above Genitourinary: Negative for dysuria. Musculoskeletal: Negative  for back pain. Skin: Negative for rash. Neurological: As above    ____________________________________________   PHYSICAL EXAM:  VITAL SIGNS: ED Triage Vitals  Enc Vitals Group     BP 03/13/18 1453 (!) 102/58     Pulse Rate 03/13/18 1453 (!) 103     Resp 03/13/18 1453 16     Temp 03/13/18 1453 98.9 F (37.2 C)     Temp Source 03/13/18 1453 Oral     SpO2 03/13/18 1453 99 %     Weight 03/13/18 1455 63.5 kg (140 lb)     Height 03/13/18 1455 1.524 m (5')     Head Circumference --      Peak Flow --      Pain Score 03/13/18 1455 7     Pain Loc --      Pain Edu? --      Excl. in GC? --      Constitutional: Alert and oriented. No acute distress. Pleasant and interactive Eyes: Conjunctivae are normal.  Head: Atraumatic. Nose: No congestion/rhinnorhea. Mouth/Throat: Mucous membranes are moist.   Cardiovascular: Normal rate, regular rhythm.  Respiratory: Normal respiratory effort.  No retractions. Genitourinary: deferred Musculoskeletal: No lower extremity tenderness nor edema.   Neurologic:  Normal speech and language. No gross focal neurologic deficits are appreciated.   Skin:  Skin is warm, dry and intact. No rash noted.   ____________________________________________   LABS (all labs ordered are listed, but only abnormal results are displayed)  Labs Reviewed - No data to display ____________________________________________  EKG   ____________________________________________  RADIOLOGY  None ____________________________________________   PROCEDURES  Procedure(s) performed: No  Procedures   Critical Care performed: No ____________________________________________   INITIAL IMPRESSION / ASSESSMENT AND PLAN / ED COURSE  Pertinent labs & imaging results that were available during my care of the patient were reviewed by me and considered in my medical decision making (see chart for details).  Patient well-appearing in no acute distress, she is  asymptomatic today.  No indication for further work-up at this time recommend outpatient follow-up as needed.   ____________________________________________   FINAL CLINICAL IMPRESSION(S) / ED DIAGNOSES  Final diagnoses:  Weakness      NEW MEDICATIONS STARTED DURING THIS VISIT:  Discharge Medication List as of 03/13/2018  3:25 PM       Note:  This document was prepared using Dragon voice recognition software and may include unintentional dictation errors.   Jene Every, MD 03/13/18 1622

## 2018-03-13 NOTE — ED Notes (Signed)
PT verbalizes d/c understanding and follow up. PT in NAD at time of departure, PT unable to sign discharge signature due to malfnx with signature pad

## 2018-03-13 NOTE — ED Triage Notes (Signed)
Pt here for headache and feeling like has fever.  Had a "stomach ache" yesterday all day per pt.  Had a little diarrhea.  No vomiting. No fever here.  VSS.  Pt denies abdominal pain today. Only symptom today is headache and "feeling hot".  Ambulatory. NAD. Does not appear in discomfort.

## 2018-06-26 ENCOUNTER — Other Ambulatory Visit
Admit: 2018-06-26 | Discharge: 2018-06-26 | Disposition: A | Discharge status: Home / Self Care | Payer: Medicaid Other | Attending: Family Medicine | Admitting: Family Medicine

## 2018-06-26 NOTE — ED Notes (Signed)
Blood drawn for police evidence. Pt signed consent form without any complaints. Pt has no injuries, here for evidence collection only. Blood given to [employer], drawn per hospital policy for police evidence collection.

## 2022-01-21 ENCOUNTER — Ambulatory Visit: Payer: Self-pay

## 2022-02-03 HISTORY — PX: INDUCED ABORTION: SHX677

## 2022-02-18 ENCOUNTER — Ambulatory Visit: Payer: Medicaid Other

## 2022-02-28 ENCOUNTER — Ambulatory Visit: Payer: Medicaid Other | Admitting: Family Medicine

## 2022-02-28 ENCOUNTER — Ambulatory Visit: Payer: Self-pay | Admitting: Family Medicine

## 2022-03-23 ENCOUNTER — Ambulatory Visit (LOCAL_COMMUNITY_HEALTH_CENTER): Payer: Medicaid Other | Admitting: Family Medicine

## 2022-03-23 ENCOUNTER — Ambulatory Visit: Payer: Medicaid Other

## 2022-03-23 VITALS — BP 105/68 | HR 90 | Ht 62.0 in | Wt 158.8 lb

## 2022-03-23 DIAGNOSIS — Z309 Encounter for contraceptive management, unspecified: Secondary | ICD-10-CM

## 2022-03-23 DIAGNOSIS — Z113 Encounter for screening for infections with a predominantly sexual mode of transmission: Secondary | ICD-10-CM

## 2022-03-23 DIAGNOSIS — Z3009 Encounter for other general counseling and advice on contraception: Secondary | ICD-10-CM

## 2022-03-23 DIAGNOSIS — Z01419 Encounter for gynecological examination (general) (routine) without abnormal findings: Secondary | ICD-10-CM | POA: Diagnosis not present

## 2022-03-23 LAB — WET PREP FOR TRICH, YEAST, CLUE
Trichomonas Exam: NEGATIVE
Yeast Exam: NEGATIVE

## 2022-03-23 LAB — HM HIV SCREENING LAB: HM HIV Screening: NEGATIVE

## 2022-03-23 MED ORDER — MEDROXYPROGESTERONE ACETATE 150 MG/ML IM SUSP: 150.0000 mg | INTRAMUSCULAR | Status: DC

## 2022-03-23 MED ORDER — ETONOGESTREL-ETHINYL ESTRADIOL 0.12-0.015 MG/24HR VA RING: VAGINAL_RING | VAGINAL | 12 refills | Status: DC

## 2022-03-23 NOTE — Progress Notes (Signed)
Referral for BTL faxed to [place]. Confirmed fax received.

## 2022-03-23 NOTE — Progress Notes (Signed)
Pt appointment for PE, Pap, STI screening, and BC counseling. Seen by FNP Lowella Petties. Initial lab results reviewed with pt. Family planning packet given and contents reviewed. Birth control counseling provided.

## 2022-03-23 NOTE — Progress Notes (Signed)
[facility] Number: [phone]  Family Planning Visit- Initial Visit  Subjective:  Sheri Brooks is a 27 y.o.  [id number]   being seen today for an initial annual visit and to discuss reproductive life planning.  The patient is currently using Hormonal Injection for pregnancy prevention. Patient reports   does not want a pregnancy in the next year.     report they are looking for a method that provides High efficacy at preventing pregnancy  Patient has the following medical conditions has History of depression on their problem list.  Chief Complaint  Patient presents with   Contraception    PE and Nex insertion   Gynecologic Exam    Needs Pap    Patient reports to clinic for Nexplanon today. Pt is a G2P2 who reports not wanting any more kids. We did not have nexplanon available in clinic today. Patient was given other options for [facility]- and decided to get depo today. Pt wants a referral for BTL and to be called when we get the Nexplanon back in stock.  Patient denies concerns about self today   Body mass index is 29.04 kg/m. - Patient is eligible for diabetes screening based on BMI> 25 and age >35?  no HA1C ordered? not applicable  Patient reports 1  partner/s in last year. Desires STI screening?  Yes  Has patient been screened once for HCV in the past?  No  No results found for: "HCVAB"  Does the patient have current drug use (including MJ), have a partner with drug use, and/or has been incarcerated since last result? No  If yes-- Screen for HCV through [facility]   Does the patient meet criteria for HBV testing? No  Criteria:  -Household, sexual or needle sharing contact with HBV -History of drug use -HIV positive -Those with known Hep C   Health Maintenance Due  Topic Date Due   COVID-19 Vaccine (1) Never done   HPV VACCINES (1 - 2-dose series) Never done   Hepatitis C  Screening  Never done   PAP-Cervical Cytology Screening  05/15/2020   PAP SMEAR-Modifier  05/15/2020   INFLUENZA VACCINE  Never done    Review of Systems  Constitutional:  Negative for weight loss.  Eyes:  Negative for blurred vision.  Respiratory:  Negative for cough and shortness of breath.   Cardiovascular:  Negative for claudication.  Gastrointestinal:  Negative for nausea.  Genitourinary:  Negative for dysuria and frequency.  Skin:  Negative for rash.  Neurological:  Negative for headaches.  Endo/Heme/Allergies:  Does not bruise/bleed easily.    The following portions of the patient's history were reviewed and updated as appropriate: allergies, current medications, past family history, past medical history, past social history, past surgical history and problem list. Problem list updated.   See flowsheet for other program required questions.  Objective:   Vitals:   03/23/22 1307  BP: 105/68  Pulse: 90  Weight: 158 lb 12.8 oz (72 kg)  Height: 5\' 2"  (1.575 m)    Physical Exam Vitals and nursing note reviewed.  Constitutional:      Appearance: Normal appearance.  HENT:     Head: Normocephalic and atraumatic.     Mouth/Throat:     Mouth: Mucous membranes are moist.     Pharynx: Oropharynx is clear. No oropharyngeal exudate or posterior oropharyngeal erythema.  Pulmonary:     Effort: Pulmonary effort is normal.  Abdominal:  General: Abdomen is flat.     Palpations: There is no mass.     Tenderness: There is no abdominal tenderness. There is no rebound.  Genitourinary:    General: Normal vulva.     Exam position: Lithotomy position.     Pubic Area: No rash or pubic lice.      Labia:        Right: No rash or lesion.        Left: No rash or lesion.      Vagina: Normal. No vaginal discharge, erythema, bleeding or lesions.     Cervix: No cervical motion tenderness, discharge, friability, lesion or erythema.     Uterus: Normal.      Adnexa: Right adnexa normal  and left adnexa normal.     Rectum: Normal.     Comments: pH = 4 Lymphadenopathy:     Head:     Right side of head: No preauricular or posterior auricular adenopathy.     Left side of head: No preauricular or posterior auricular adenopathy.     Cervical: No cervical adenopathy.     Upper Body:     Right upper body: No supraclavicular, axillary or epitrochlear adenopathy.     Left upper body: No supraclavicular, axillary or epitrochlear adenopathy.     Lower Body: No right inguinal adenopathy. No left inguinal adenopathy.  Skin:    General: Skin is warm and dry.     Findings: No rash.  Neurological:     Mental Status: She is alert and oriented to person, place, and time.    Assessment and Plan:  Sheri Brooks is a 27 y.o. female presenting to the [facility] Department for an initial annual wellness/contraceptive visit  Contraception counseling: Reviewed options based on patient desire and reproductive life plan. Patient is interested in Hormonal Injection. This was provided to the patient today.   Risks, benefits, and typical effectiveness rates were reviewed.  Questions were answered.  Written information was also given to the patient to review.    The patient will follow up in  1 years for surveillance.  The patient was told to call with any further questions, or with any concerns about this method of contraception.  Emphasized use of condoms 100% of the time for STI prevention.  Need for ECP was assessed. Not indicated- patient has been using condoms with sex.   1. Well woman exam with routine gynecological exam -no concerns about self today -encouraged dental visits  - IGP, rfx Aptima HPV ASCU  2. Family planning -Patient discussed wanting Nexplanon, or BTL. Unable to provide these options in clinic today. Reviewed that we can call when Nexplanon is available and I can refer her for BTL if she wants to do that. Pt changed her mind multiple times,  signing consents for IUD, nexplanon, depo and the ring. She then decided to get ring.  -referral faxed to [place] for BTL  - etonogestrel-ethinyl estradiol (NUVARING) 0.12-0.015 MG/24HR vaginal ring; Insert vaginally and leave in place for 3 consecutive weeks, then remove for 1 week.  Dispense: 1 each; Refill: 12  3. Screening for venereal disease  - Chlamydia/Gonorrhea [facility] - HIV [facility] - Syphilis Serology, [facility] - WET PREP FOR [place], YEAST, CLUE   Return in about 1 year (around 03/23/2023).  No future appointments.  Sharlet Salina, [place]

## 2022-03-29 LAB — IGP, RFX APTIMA HPV ASCU: PAP Smear Comment: 0

## 2022-04-07 NOTE — Addendum Note (Signed)
Addended by: Sharlet Salina on: 04/07/2022 08:41 AM   Modules accepted: Level of Service

## 2022-04-27 ENCOUNTER — Encounter: Payer: Self-pay | Admitting: Obstetrics and Gynecology

## 2022-04-27 ENCOUNTER — Ambulatory Visit (INDEPENDENT_AMBULATORY_CARE_PROVIDER_SITE_OTHER): Payer: Medicaid Other | Admitting: Obstetrics and Gynecology

## 2022-04-27 VITALS — BP 90/61 | HR 71 | Ht 62.0 in | Wt 154.5 lb

## 2022-04-27 DIAGNOSIS — Z3009 Encounter for other general counseling and advice on contraception: Secondary | ICD-10-CM | POA: Diagnosis not present

## 2022-04-27 DIAGNOSIS — Z7689 Persons encountering health services in other specified circumstances: Secondary | ICD-10-CM | POA: Diagnosis not present

## 2022-04-27 NOTE — Progress Notes (Signed)
Patient presents today to discuss having a BTL. She states she is done having children. She recently wanted a Nexplanon but began using the nuvaring.

## 2022-04-27 NOTE — Progress Notes (Signed)
HPI:      [name] is a 27 y.o. [id number] who LMP was Patient's last menstrual period was 04/17/2022.  Subjective:   She presents today to discuss BTL.  She is currently using NuvaRing for birth control.  She would like something permanent.  She has no concerns regarding her normal menstrual period.  She is certain she desires permanent sterilization.  No prior abdominal surgery.    Hx: The following portions of the patient's history were reviewed and updated as appropriate:             She  has no past medical history on file. She does not have any pertinent problems on file. She  has no past surgical history on file. Her family history is not on file. She  reports that she has been smoking e-cigarettes. She started smoking about 5 months ago. She has never used smokeless tobacco. She reports that she does not drink alcohol and does not use drugs. She has a current medication list which includes the following prescription(s): etonogestrel-ethinyl estradiol and ibuprofen. She has No Known Allergies.       Review of Systems:  Review of Systems  Constitutional: Denied constitutional symptoms, night sweats, recent illness, fatigue, fever, insomnia and weight loss.  Eyes: Denied eye symptoms, eye pain, photophobia, vision change and visual disturbance.  Ears/Nose/Throat/Neck: Denied ear, nose, throat or neck symptoms, hearing loss, nasal discharge, sinus congestion and sore throat.  Cardiovascular: Denied cardiovascular symptoms, arrhythmia, chest pain/pressure, edema, exercise intolerance, orthopnea and palpitations.  Respiratory: Denied pulmonary symptoms, asthma, pleuritic pain, productive sputum, cough, dyspnea and wheezing.  Gastrointestinal: Denied, gastro-esophageal reflux, melena, nausea and vomiting.  Genitourinary: Denied genitourinary symptoms including symptomatic vaginal discharge, pelvic relaxation issues, and urinary complaints.  Musculoskeletal: Denied  musculoskeletal symptoms, stiffness, swelling, muscle weakness and myalgia.  Dermatologic: Denied dermatology symptoms, rash and scar.  Neurologic: Denied neurology symptoms, dizziness, headache, neck pain and syncope.  Psychiatric: Denied psychiatric symptoms, anxiety and depression.  Endocrine: Denied endocrine symptoms including hot flashes and night sweats.   Meds:   Current Outpatient Medications on File Prior to Visit  Medication Sig Dispense Refill   etonogestrel-ethinyl estradiol (NUVARING) 0.12-0.015 MG/24HR vaginal ring Insert vaginally and leave in place for 3 consecutive weeks, then remove for 1 week. 1 each 12   ibuprofen (ADVIL,MOTRIN) 600 MG tablet Take 1 tablet (600 mg total) by mouth every 6 (six) hours. 30 tablet 0   No current facility-administered medications on file prior to visit.      Objective:     Vitals:   04/27/22 1018  BP: 90/61  Pulse: 71   Filed Weights   04/27/22 1018  Weight: 154 lb 8 oz (70.1 kg)                        Assessment:    [id number] Patient Active Problem List   Diagnosis Date Noted   History of depression 04/18/2013     1. Establishing care with new doctor, encounter for   2. Encounter for consultation for female sterilization     Patient desires permanent sterilization   Plan:            1.  Tubal We have discussed permanent sterilization in detail.  I have reviewed Filshie clip placement as a means of sterilization.  I have explained the risks and benefits of this procedure in detail.  I have stressed the fact that this is a permanent and not  reversible procedure and there is a failure rate of 3 to7 per 1000 which is somewhat timing and method dependent.  I have discussed the possibility of inadvertent damage to bowel, bladder, blood vessels or other internal organs..  I have specifically discussed the risk of anesthesia, infection and blood loss with her.  We have discussed the possibility of laparotomy should there be  a problem and the additional possibility of a longer hospital stay.  I have spoken with her regarding the recovery period, informing her that after this procedure, most people are performing their normal daily activities within one week.  I have recommended abstinence or another birth control method for 2-4 weeks following this procedure.  Other options of birth control have also been discussed.  The procedure of sterilization and alternate methods of birth control were discussed in detail with the patient.  I have answered all her questions and I believe that she has an adequate and informed understanding of the procedure.   We have discussed other forms of long-acting contraception and patient has declined all of these options. She will sign tubal ligation papers today.  Orders No orders of the defined types were placed in this encounter.   No orders of the defined types were placed in this encounter.     F/U  Return in about 4 weeks (around 05/25/2022). I spent 31 minutes involved in the care of this patient preparing to see the patient by obtaining and reviewing her medical history (including labs, imaging tests and prior procedures), documenting clinical information in the electronic health record (EHR), counseling and coordinating care plans, writing and sending prescriptions, ordering tests or procedures and in direct communicating with the patient and medical staff discussing pertinent items from her history and physical exam.  Elonda Husky, M.D. 04/27/2022 10:49 AM

## 2022-05-31 NOTE — Progress Notes (Signed)
Confirmed pt seen at [facility] to discuss/plan for tubal ligation. Referral complete and closed.

## 2022-06-01 ENCOUNTER — Encounter: Payer: Self-pay | Admitting: Obstetrics and Gynecology

## 2022-06-01 ENCOUNTER — Ambulatory Visit (INDEPENDENT_AMBULATORY_CARE_PROVIDER_SITE_OTHER): Payer: Medicaid Other | Admitting: Obstetrics and Gynecology

## 2022-06-01 VITALS — BP 104/71 | HR 77 | Ht 62.0 in | Wt 149.6 lb

## 2022-06-01 DIAGNOSIS — Z3009 Encounter for other general counseling and advice on contraception: Secondary | ICD-10-CM | POA: Diagnosis not present

## 2022-06-01 DIAGNOSIS — Z01818 Encounter for other preprocedural examination: Secondary | ICD-10-CM

## 2022-06-01 NOTE — H&P (Signed)
PRE-OPERATIVE HISTORY AND PHYSICAL EXAM  PCP:  Patient, No Pcp Per Subjective:   HPI:  Sheri Brooks is a 27 y.o. [id number].  Patient's last menstrual period was 05/12/2022.  She presents today for a pre-op discussion and PE.  She has the following symptoms: Desires permanent sterilization  Review of Systems:   Constitutional: Denied constitutional symptoms, night sweats, recent illness, fatigue, fever, insomnia and weight loss.  Eyes: Denied eye symptoms, eye pain, photophobia, vision change and visual disturbance.  Ears/Nose/Throat/Neck: Denied ear, nose, throat or neck symptoms, hearing loss, nasal discharge, sinus congestion and sore throat.  Cardiovascular: Denied cardiovascular symptoms, arrhythmia, chest pain/pressure, edema, exercise intolerance, orthopnea and palpitations.  Respiratory: Denied pulmonary symptoms, asthma, pleuritic pain, productive sputum, cough, dyspnea and wheezing.  Gastrointestinal: Denied, gastro-esophageal reflux, melena, nausea and vomiting.  Genitourinary: Denied genitourinary symptoms including symptomatic vaginal discharge, pelvic relaxation issues, and urinary complaints.  Musculoskeletal: Denied musculoskeletal symptoms, stiffness, swelling, muscle weakness and myalgia.  Dermatologic: Denied dermatology symptoms, rash and scar.  Neurologic: Denied neurology symptoms, dizziness, headache, neck pain and syncope.  Psychiatric: Denied psychiatric symptoms, anxiety and depression.  Endocrine: Denied endocrine symptoms including hot flashes and night sweats.   OB History  Gravida Para Term Preterm AB Living  2 2 2     2   SAB IAB Ectopic Multiple Live Births        0 2    # Outcome Date GA Lbr Len/2nd Weight Sex Delivery Anes PTL Lv  2 Term 11/08/17 [redacted]w[redacted]d / 00:17 6 lb 4.2 oz (2.84 kg) F Vag-Spont EPI  LIV  1 Term 36    M Vag-Spont  N LIV    History reviewed. No pertinent past medical history.  History reviewed. No pertinent  surgical history.    SOCIAL HISTORY:  Social History   Tobacco Use  Smoking Status Every Day   Types: E-cigarettes   Start date: 11/17/2021  Smokeless Tobacco Never   Social History   Substance and Sexual Activity  Alcohol Use No   Comment: occassional drink every 2-3 months    Social History   Substance and Sexual Activity  Drug Use No    History reviewed. No pertinent family history.  ALLERGIES:  Patient has no known allergies.  MEDS:   Current Outpatient Medications on File Prior to Visit  Medication Sig Dispense Refill   etonogestrel-ethinyl estradiol (NUVARING) 0.12-0.015 MG/24HR vaginal ring Insert vaginally and leave in place for 3 consecutive weeks, then remove for 1 week. 1 each 12   ibuprofen (ADVIL,MOTRIN) 600 MG tablet Take 1 tablet (600 mg total) by mouth every 6 (six) hours. 30 tablet 0   No current facility-administered medications on file prior to visit.    No orders of the defined types were placed in this encounter.    Physical examination BP 104/71   Pulse 77   Ht 5\' 2"  (1.575 m)   Wt 149 lb 9.6 oz (67.9 kg)   LMP 05/12/2022   BMI 27.36 kg/m   General NAD, Conversant  HEENT Atraumatic; Op clear with mmm.  Normo-cephalic. Pupils reactive. Anicteric sclerae  Thyroid/Neck Smooth without nodularity or enlargement. Normal ROM.  Neck Supple.  Skin No rashes, lesions or ulceration. Normal palpated skin turgor. No nodularity.  Breasts: No masses or discharge.  Symmetric.  No axillary adenopathy.  Lungs: Clear to auscultation.No rales or wheezes. Normal Respiratory effort, no retractions.  Heart: NSR.  No murmurs or rubs appreciated. No peripheral  edema  Abdomen: Soft.  Non-tender.  No masses.  No HSM. No hernia  Extremities: Moves all appropriately.  Normal ROM for age. No lymphadenopathy.  Neuro: Oriented to PPT.  Normal mood. Normal affect.     Pelvic:   Vulva: Normal appearance.  No lesions.  Vagina: No lesions or abnormalities noted.   Support: Normal pelvic support.  Urethra No masses tenderness or scarring.  Meatus Normal size without lesions or prolapse.  Cervix: Normal ectropion.  No lesions.  Anus: Normal exam.  No lesions.  Perineum: Normal exam.  No lesions.        Bimanual   Uterus: Normal size.  Non-tender.  Mobile.  AV.  Adnexae: No masses.  Non-tender to palpation.  Cul-de-sac: Negative for abnormality.   Assessment:   [id number] Patient Active Problem List   Diagnosis Date Noted   History of depression 04/18/2013    1. Pre-op exam   2. Encounter for consultation for female sterilization      Plan:   Orders: No orders of the defined types were placed in this encounter.    1.  Laparoscopic sterilization using Filshie clips

## 2022-06-01 NOTE — Progress Notes (Signed)
Patient presents today for a pre-op exam prior to BTL. She states no additional concerns today.  

## 2022-06-01 NOTE — Progress Notes (Signed)
PRE-OPERATIVE HISTORY AND PHYSICAL EXAM  PCP:  Patient, No Pcp Per Subjective:   HPI:  Sheri Brooks is a 27 y.o. [id number].  Patient's last menstrual period was 05/12/2022.  She presents today for a pre-op discussion and PE.  She has the following symptoms: Desires permanent sterilization  Review of Systems:   Constitutional: Denied constitutional symptoms, night sweats, recent illness, fatigue, fever, insomnia and weight loss.  Eyes: Denied eye symptoms, eye pain, photophobia, vision change and visual disturbance.  Ears/Nose/Throat/Neck: Denied ear, nose, throat or neck symptoms, hearing loss, nasal discharge, sinus congestion and sore throat.  Cardiovascular: Denied cardiovascular symptoms, arrhythmia, chest pain/pressure, edema, exercise intolerance, orthopnea and palpitations.  Respiratory: Denied pulmonary symptoms, asthma, pleuritic pain, productive sputum, cough, dyspnea and wheezing.  Gastrointestinal: Denied, gastro-esophageal reflux, melena, nausea and vomiting.  Genitourinary: Denied genitourinary symptoms including symptomatic vaginal discharge, pelvic relaxation issues, and urinary complaints.  Musculoskeletal: Denied musculoskeletal symptoms, stiffness, swelling, muscle weakness and myalgia.  Dermatologic: Denied dermatology symptoms, rash and scar.  Neurologic: Denied neurology symptoms, dizziness, headache, neck pain and syncope.  Psychiatric: Denied psychiatric symptoms, anxiety and depression.  Endocrine: Denied endocrine symptoms including hot flashes and night sweats.   OB History  Gravida Para Term Preterm AB Living  2 2 2     2   SAB IAB Ectopic Multiple Live Births        0 2    # Outcome Date GA Lbr Len/2nd Weight Sex Delivery Anes PTL Lv  2 Term 11/08/17 [redacted]w[redacted]d / 00:17 6 lb 4.2 oz (2.84 kg) F Vag-Spont EPI  LIV  1 Term 15    M Vag-Spont  N LIV    History reviewed. No pertinent past medical history.  History reviewed. No pertinent  surgical history.    SOCIAL HISTORY:  Social History   Tobacco Use  Smoking Status Every Day   Types: E-cigarettes   Start date: 11/17/2021  Smokeless Tobacco Never   Social History   Substance and Sexual Activity  Alcohol Use No   Comment: occassional drink every 2-3 months    Social History   Substance and Sexual Activity  Drug Use No    History reviewed. No pertinent family history.  ALLERGIES:  Patient has no known allergies.  MEDS:   Current Outpatient Medications on File Prior to Visit  Medication Sig Dispense Refill   etonogestrel-ethinyl estradiol (NUVARING) 0.12-0.015 MG/24HR vaginal ring Insert vaginally and leave in place for 3 consecutive weeks, then remove for 1 week. 1 each 12   ibuprofen (ADVIL,MOTRIN) 600 MG tablet Take 1 tablet (600 mg total) by mouth every 6 (six) hours. 30 tablet 0   No current facility-administered medications on file prior to visit.    No orders of the defined types were placed in this encounter.    Physical examination BP 104/71   Pulse 77   Ht 5\' 2"  (1.575 m)   Wt 149 lb 9.6 oz (67.9 kg)   LMP 05/12/2022   BMI 27.36 kg/m   General NAD, Conversant  HEENT Atraumatic; Op clear with mmm.  Normo-cephalic. Pupils reactive. Anicteric sclerae  Thyroid/Neck Smooth without nodularity or enlargement. Normal ROM.  Neck Supple.  Skin No rashes, lesions or ulceration. Normal palpated skin turgor. No nodularity.  Breasts: No masses or discharge.  Symmetric.  No axillary adenopathy.  Lungs: Clear to auscultation.No rales or wheezes. Normal Respiratory effort, no retractions.  Heart: NSR.  No murmurs or rubs appreciated. No peripheral  edema  Abdomen: Soft.  Non-tender.  No masses.  No HSM. No hernia  Extremities: Moves all appropriately.  Normal ROM for age. No lymphadenopathy.  Neuro: Oriented to PPT.  Normal mood. Normal affect.     Pelvic:   Vulva: Normal appearance.  No lesions.  Vagina: No lesions or abnormalities noted.   Support: Normal pelvic support.  Urethra No masses tenderness or scarring.  Meatus Normal size without lesions or prolapse.  Cervix: Normal ectropion.  No lesions.  Anus: Normal exam.  No lesions.  Perineum: Normal exam.  No lesions.        Bimanual   Uterus: Normal size.  Non-tender.  Mobile.  AV.  Adnexae: No masses.  Non-tender to palpation.  Cul-de-sac: Negative for abnormality.   Assessment:   [id number] Patient Active Problem List   Diagnosis Date Noted   History of depression 04/18/2013    1. Pre-op exam   2. Encounter for consultation for female sterilization      Plan:   Orders: No orders of the defined types were placed in this encounter.    1.  Laparoscopic sterilization using Filshie clips  Pre-op discussions regarding Risks and Benefits of her scheduled surgery.  Tubal We have discussed permanent sterilization in detail.  I have reviewed Filshie clip placement as a means of sterilization.  I have explained the risks and benefits of this procedure in detail.  I have stressed the fact that this is a permanent and not reversible procedure and there is a failure rate of 3 to7 per 1000 which is somewhat timing and method dependent.  I have discussed the possibility of inadvertent damage to bowel, bladder, blood vessels or other internal organs..  I have specifically discussed the risk of anesthesia, infection and blood loss with her.  We have discussed the possibility of laparotomy should there be a problem and the additional possibility of a longer hospital stay.  I have spoken with her regarding the recovery period, informing her that after this procedure, most people are performing their normal daily activities within one week.  I have recommended abstinence or another birth control method for 2-4 weeks following this procedure.  Other options of birth control have also been discussed.  The procedure of sterilization and alternate methods of birth control were discussed  in detail with the patient.  I have answered all her questions and I believe that she has an adequate and informed understanding of the procedure.  I spent 32 minutes involved in the care of this patient preparing to see the patient by obtaining and reviewing her medical history (including labs, imaging tests and prior procedures), documenting clinical information in the electronic health record (EHR), counseling and coordinating care plans, writing and sending prescriptions, ordering tests or procedures and in direct communicating with the patient and medical staff discussing pertinent items from her history and physical exam.   Elonda Husky, M.D. 06/01/2022 11:51 AM

## 2022-06-18 ENCOUNTER — Encounter: Payer: Self-pay | Admitting: Certified Registered Nurse Anesthetist

## 2022-06-18 ENCOUNTER — Encounter: Payer: Self-pay | Admitting: Urgent Care

## 2022-06-20 ENCOUNTER — Encounter
Admission: RE | Admit: 2022-06-20 | Discharge: 2022-06-20 | Disposition: A | Discharge status: Home / Self Care | Payer: Medicaid Other | Source: Ambulatory Visit | Attending: Obstetrics and Gynecology | Admitting: Obstetrics and Gynecology

## 2022-06-20 ENCOUNTER — Other Ambulatory Visit: Payer: Self-pay

## 2022-06-20 VITALS — Ht 62.0 in | Wt 150.0 lb

## 2022-06-20 DIAGNOSIS — Z01812 Encounter for preprocedural laboratory examination: Secondary | ICD-10-CM

## 2022-06-20 NOTE — Patient Instructions (Signed)
Your procedure is scheduled on: Monday June 27, 2022. Report to the Registration Desk on the 1st floor of the [facility]. To find out your arrival time, please call [phone] between 1PM - 3PM on: Friday June 21, 20243 If your arrival time is 6:00 am, do not arrive before that time as the [facility] entrance doors do not open until 6:00 am.  REMEMBER: Instructions that are not followed completely may result in serious medical risk, up to and including death; or upon the discretion of your surgeon and anesthesiologist your surgery may need to be rescheduled.  Do not eat food or drink fluids after midnight the night before surgery.  No gum chewing or hard candies.   One week prior to surgery: Stop Anti-inflammatories (NSAIDS) such as Advil, Aleve, Ibuprofen, Motrin, Naproxen, Naprosyn and Aspirin based products such as Excedrin, Goody's Powder, BC Powder. Stop ANY OVER THE COUNTER supplements until after surgery. You may however, continue to take Tylenol if needed for pain up until the day of surgery.  Continue taking all prescribed medications with the exception of the following:   Follow recommendations from Cardiologist or PCP regarding stopping blood thinners.  TAKE ONLY THESE MEDICATIONS THE MORNING OF SURGERY WITH A SIP OF WATER:  None    No Alcohol for 24 hours before or after surgery.  No Smoking including e-cigarettes for 24 hours before surgery.  No chewable tobacco products for at least 6 hours before surgery.  No nicotine patches on the day of surgery.  Do not use any "recreational" drugs for at least a week (preferably 2 weeks) before your surgery.  Please be advised that the combination of cocaine and anesthesia may have negative outcomes, up to and including death. If you test positive for cocaine, your surgery will be cancelled.  On the morning of surgery brush your teeth with toothpaste and water, you may rinse your mouth with mouthwash if you wish. Do  not swallow any toothpaste or mouthwash.  Use CHG Soap or wipes as directed on instruction sheet.  Do not wear jewelry, make-up, hairpins, clips or nail polish.  Do not wear lotions, powders, or perfumes.   Do not shave body hair from the neck down 48 hours before surgery.  Contact lenses, hearing aids and dentures may not be worn into surgery.  Do not bring valuables to the hospital. [facility] is not responsible for any missing/lost belongings or valuables.   Total Shoulder Arthroplasty:  use Benzoyl Peroxide 5% Gel as directed on instruction sheet.  Bring your C-PAP to the hospital in case you may have to spend the night.   Notify your doctor if there is any change in your medical condition (cold, fever, infection).  Wear comfortable clothing (specific to your surgery type) to the hospital.  After surgery, you can help prevent lung complications by doing breathing exercises.  Take deep breaths and cough every 1-2 hours. Your doctor may order a device called an Incentive Spirometer to help you take deep breaths. When coughing or sneezing, hold a pillow firmly against your incision with both hands. This is called "splinting." Doing this helps protect your incision. It also decreases belly discomfort.  If you are being admitted to the hospital overnight, leave your suitcase in the car. After surgery it may be brought to your room.  In case of increased patient census, it may be necessary for you, the patient, to continue your postoperative care in the Same Day Surgery department.  If you are  being discharged the day of surgery, you will not be allowed to drive home. You will need a responsible individual to drive you home and stay with you for 24 hours after surgery.   If you are taking public transportation, you will need to have a responsible individual with you.  Please call the Pre-admissions Testing Dept. at [phone] if you have any questions about these  instructions.  Surgery Visitation Policy:  Patients having surgery or a procedure may have two visitors.  Children under the age of 49 must have an adult with them who is not the patient.  Inpatient Visitation:    Visiting hours are 7 a.m. to 8 p.m. Up to four visitors are allowed at one time in a patient room. The visitors may rotate out with other people during the day.  One visitor age 32 or older may stay with the patient overnight and must be in the room by 8 p.m.    Preparing for Surgery with CHLORHEXIDINE GLUCONATE (CHG) Soap  Chlorhexidine Gluconate (CHG) Soap  o An antiseptic cleaner that kills germs and bonds with the skin to continue killing germs even after washing  o Used for showering the night before surgery and morning of surgery  Before surgery, you can play an important role by reducing the number of germs on your skin.  CHG (Chlorhexidine gluconate) soap is an antiseptic cleanser which kills germs and bonds with the skin to continue killing germs even after washing.  Please do not use if you have an allergy to CHG or antibacterial soaps. If your skin becomes reddened/irritated stop using the CHG.  1. Shower the NIGHT BEFORE SURGERY and the MORNING OF SURGERY with CHG soap.  2. If you choose to wash your hair, wash your hair first as usual with your normal shampoo.  3. After shampooing, rinse your hair and body thoroughly to remove the shampoo.  4. Use CHG as you would any other liquid soap. You can apply CHG directly to the skin and wash gently with a scrungie or a clean washcloth.  5. Apply the CHG soap to your body only from the neck down. Do not use on open wounds or open sores. Avoid contact with your eyes, ears, mouth, and genitals (private parts). Wash face and genitals (private parts) with your normal soap.  6. Wash thoroughly, paying special attention to the area where your surgery will be performed.  7. Thoroughly rinse your body with warm  water.  8. Do not shower/wash with your normal soap after using and rinsing off the CHG soap.  9. Pat yourself dry with a clean towel.  10. Wear clean pajamas to bed the night before surgery.  12. Place clean sheets on your bed the night of your first shower and do not sleep with pets.  13. Shower again with the CHG soap on the day of surgery prior to arriving at the hospital.  14. Do not apply any deodorants/lotions/powders.  15. Please wear clean clothes to the hospital.

## 2022-06-23 ENCOUNTER — Inpatient Hospital Stay: Admission: RE | Admit: 2022-06-23 | Payer: Medicaid Other | Source: Ambulatory Visit

## 2022-06-27 ENCOUNTER — Encounter: Admission: RE | Payer: Self-pay | Source: Home / Self Care

## 2022-06-27 ENCOUNTER — Ambulatory Visit
Admission: RE | Admit: 2022-06-27 | Payer: Medicaid Other | Source: Home / Self Care | Admitting: Obstetrics and Gynecology

## 2022-06-27 ENCOUNTER — Other Ambulatory Visit: Payer: Self-pay

## 2022-06-27 ENCOUNTER — Encounter: Payer: Self-pay | Admitting: Obstetrics and Gynecology

## 2022-06-27 SURGERY — LIGATION, FALLOPIAN TUBE, LAPAROSCOPIC
Anesthesia: Choice

## 2022-06-27 MED ORDER — PROPOFOL 10 MG/ML IV BOLUS
INTRAVENOUS | Status: AC
  Filled 2022-06-27: qty 20

## 2022-06-27 MED ORDER — ORAL CARE MOUTH RINSE: 15.0000 mL | Freq: Once | OROMUCOSAL | Status: DC

## 2022-06-27 MED ORDER — LACTATED RINGERS IV SOLN: INTRAVENOUS | Status: DC

## 2022-06-27 MED ORDER — FENTANYL CITRATE (PF) 100 MCG/2ML IJ SOLN
INTRAMUSCULAR | Status: AC
  Filled 2022-06-27: qty 2

## 2022-06-27 MED ORDER — MIDAZOLAM HCL 2 MG/2ML IJ SOLN
INTRAMUSCULAR | Status: AC
  Filled 2022-06-27: qty 2

## 2022-06-27 MED ORDER — FAMOTIDINE 20 MG PO TABS: 20.0000 mg | ORAL_TABLET | Freq: Once | ORAL | Status: DC

## 2022-06-27 MED ORDER — CHLORHEXIDINE GLUCONATE 0.12 % MT SOLN: 15.0000 mL | Freq: Once | OROMUCOSAL | Status: DC

## 2022-06-27 MED ORDER — POVIDONE-IODINE 10 % EX SWAB: 2.0000 | Freq: Once | CUTANEOUS | Status: DC

## 2022-07-12 ENCOUNTER — Encounter: Payer: Medicaid Other | Admitting: Obstetrics and Gynecology

## 2023-06-20 ENCOUNTER — Ambulatory Visit: Admitting: Family Medicine

## 2023-06-20 ENCOUNTER — Encounter: Payer: Self-pay | Admitting: Family Medicine

## 2023-06-20 VITALS — BP 90/61 | HR 95 | Ht 60.0 in | Wt 181.2 lb

## 2023-06-20 DIAGNOSIS — Z113 Encounter for screening for infections with a predominantly sexual mode of transmission: Secondary | ICD-10-CM

## 2023-06-20 DIAGNOSIS — B9689 Other specified bacterial agents as the cause of diseases classified elsewhere: Secondary | ICD-10-CM

## 2023-06-20 DIAGNOSIS — Z30015 Encounter for initial prescription of vaginal ring hormonal contraceptive: Secondary | ICD-10-CM

## 2023-06-20 DIAGNOSIS — Z3009 Encounter for other general counseling and advice on contraception: Secondary | ICD-10-CM

## 2023-06-20 LAB — WET PREP FOR TRICH, YEAST, CLUE
Clue Cell Exam: POSITIVE — AB
Trichomonas Exam: NEGATIVE
Yeast Exam: NEGATIVE

## 2023-06-20 LAB — HM HIV SCREENING LAB: HM HIV Screening: NEGATIVE

## 2023-06-20 MED ORDER — METRONIDAZOLE 500 MG PO TABS
500.0000 mg | ORAL_TABLET | Freq: Two times a day (BID) | ORAL | Status: AC
Start: 2023-06-20 — End: ?

## 2023-06-20 MED ORDER — ETONOGESTREL-ETHINYL ESTRADIOL 0.12-0.015 MG/24HR VA RING
VAGINAL_RING | VAGINAL | 12 refills | Status: AC
Start: 2023-06-20 — End: ?

## 2023-06-20 NOTE — Patient Instructions (Addendum)
 STI screening - Today we obtained a vaginal swab to screen for gonorrhea, chlamydia, and trichomonas and oral swab to screen for gonorrhea - We also obtained a blood sample to screen for HIV and syphilis - If the results are abnormal, I will give you a call.    Estimated time frame for results collected at the [facility] Department: Same day Trichomonas Yeast BV (bacterial vaginosis)  Within 1-2 weeks Gonorrhea Chlamydia  Within 2-3 weeks HIV Syphilis Hepatitis B Hepatitis C    Birth Control Today you declined Plan B.  Instead you wanted the Nuvaring. We sent that prescription to your pharmacy.  START the Nuvaring on the last day of your next period.  We can place a Nexplanon  any time when we are CERTAIN you are not pregnant. This means waiting two weeks after last sex to take a pregnancy test.

## 2023-06-20 NOTE — Progress Notes (Signed)
 Patient is here for family planning visit. Family planning education card given to patient. Wet prep results reviewed; patient given Metronidazole 500mg  BID x 7days per order by Bohdan Bush, MD for bacterial vaginosis.  The patient was dispensed Metronidazole #14 today. I provided counseling today regarding the medication. We discussed the medication, the side effects and when to call clinic. Patient given the opportunity to ask questions. Questions answered.   Clare Critchley, RN

## 2023-06-20 NOTE — Progress Notes (Unsigned)
 [name] [facility] DEPARTMENT [facility] [address] [place] [address] Main phone: [phone]  Family Planning Visit - Repeat Yearly Visit  Subjective:  Sheri Brooks is a 28 y.o. G2P2002  being seen today for an annual wellness visit and to discuss contraception options. The patient is currently using {Upstream End Methods:32236} for pregnancy prevention. Patient {want preg?:32238::does not want a pregnancy} in the next year.   Patient reports they are looking for a method with the following characteristics:  {Contraception wants:32235:p}  Patient has the following medical problems:  Patient Active Problem List   Diagnosis Date Noted   History of depression 04/18/2013   Chief Complaint  Patient presents with   Annual Exam    Pt is here for PE and Nexplanon  insertion   HPI Patient reports increased urinary urge, small volume of urine each void. Concerned for UTI. Denies vaginal discharge, rashes, lymphadenopathy. No concerns for specific exposures.   Does have small bumps she believes to be razor burn.   Patient denies ***   ROS  See flowsheet for further details and programmatic requirements Hyperlink available at the top of the signed note in blue.  Flow sheet content below:  Pregnancy Intention Screening Does the patient want to become pregnant in the next year?: No Does the patient's partner want to become pregnant in the next year?: No Would the patient like to discuss contraceptive options today?: Yes Results Follow up Password: pink Sexual History What age did you start your period?: 13 How often do you have your period?: monthly Date of last sex?: 06/17/23 Has the patient had unprotected sex within the last 5 days?: No Do you have sex with men, women, both men and women?: Men only In the past 2 months how many partners have you had sex with?: 2 In the past 12 months, how many partners have you had  sex with?: 2 Is it possible that any of your sex partners in the past 12 months had sex with someone else whild they were still in a sexual relationship with you?: Yes What ways do you have sex?: Vaginal Do you or your partner use condoms and/or dental dams every time you have vaginal, oral or anal sex?: Sometimes Do you douche?: No Have you ever had an STD?: Yes Have any of your partners had an STD?: Yes Partner Previous STD?: Chlamydia Date?:  (5 years ago) Have you or your partner ever shot up drugs?: No Have any of your partners used drugs in the past?: No Have you or your partners exchanged money or drugs for sex?: No  Diabetes screening This patient is 28 y.o. with a BMI of Body mass index is 35.39 kg/m.Sheri Brooks  Is patient eligible for diabetes screening (age >35 and BMI >25)?  no  Was Hgb A1c ordered? not applicable  STI screening Patient reports 2 of partners in last year.  Does this patient desire STI screening?  Yes  Hepatitis C screening Has patient been screened once for HCV in the past?  Yes  No results found for: HCVAB  Does the patient meet criteria for HCV testing? No   Hepatitis B screening Does the patient meet criteria for HBV testing? No  Cervical Cancer Screening  Result Date Procedure Results Follow-ups  03/23/2022 IGP, rfx Aptima HPV ASCU DIAGNOSIS:: Comment Specimen adequacy:: Comment Clinician Provided ICD10: Comment Performed by:: Comment QC reviewed by:: Comment PAP Smear Comment: . Note:: Comment Test Methodology: Comment PAP Reflex: Comment  05/15/2017 Pap IG, rfx HPV ASCU,16/18 DIAGNOSIS:: Comment Specimen adequacy:: Comment Clinician Provided ICD10: Comment Performed by:: Comment PAP Smear Comment: . Note:: Comment Test Methodology: Comment PAP Reflex: Comment     Health Maintenance Due  Topic Date Due   HPV VACCINES (1 - 3-dose series) Never done   Hepatitis C Screening  Never done   Pneumococcal Vaccine 103-92 Years old (1 of 2 -  PCV) Never done   COVID-19 Vaccine (1 - 2024-25 season) Never done   The following portions of the patient's history were reviewed and updated as appropriate: allergies, current medications, past family history, past medical history, past social history, past surgical history and problem list. Problem list updated.  Objective:   Vitals:   06/20/23 1326  BP: 90/61  Pulse: 95  Weight: 181 lb 3.2 oz (82.2 kg)  Height: 5' (1.524 m)    Physical Exam  Assessment and Plan:  Sheri Brooks is a 28 y.o. female G2P2002 presenting to the [facility] Department for an yearly wellness and contraception visit  Contraception counseling:  Reviewed options based on patient desire and reproductive life plan. Patient is interested in {Upstream End Methods:[address]}. This {WAS/WAS NOT:[phone]::was not} provided to the patient today. *** if not why not clearly documented  Risks, benefits, and typical effectiveness rates were reviewed.  Questions were answered.  Written information was also given to the patient to review.    The patient will follow up in  {NUMBER 1-10:22536} {days/wks/mos/yrs:310907} for surveillance.  The patient was told to call with any further questions, or with any concerns about this method of contraception.  Emphasized use of condoms 100% of the time for STI prevention.  Emergency Contraception Precautions (ECP): Patient assessed for need of ECP. She {ACTION; IS/IS TDD:22025427} a candidate based on {sex options:32690}.  Educated on ECP and reviewed options.  Patient desires {ecp options:32691}.   There are no diagnoses linked to this encounter.  No follow-ups on file.  No future appointments.  Jack Marts, MD No nexplanon , cannot be certain not preg Declined plan B  Does not want depo

## 2023-06-22 DIAGNOSIS — Z30015 Encounter for initial prescription of vaginal ring hormonal contraceptive: Secondary | ICD-10-CM | POA: Insufficient documentation

## 2023-06-22 DIAGNOSIS — Z113 Encounter for screening for infections with a predominantly sexual mode of transmission: Secondary | ICD-10-CM | POA: Insufficient documentation

## 2023-06-22 DIAGNOSIS — B9689 Other specified bacterial agents as the cause of diseases classified elsewhere: Secondary | ICD-10-CM | POA: Insufficient documentation

## 2023-06-22 NOTE — Assessment & Plan Note (Signed)
 Patient desires Nexplanon  but this was not provided to the patient today as I could not be reasonably certain she is not pregnant at this time. We discussed waiting two weeks and then returning for a nexplanon  after a negative UPT. Also offered ECP and Depo Provera  today to give her some contraception coverage until we are able to safely insert a Nexplanon  later. Ms. Betzler politely declines ECP and Depo Provera . She now states she prefers Nuvaring instead and will go somewhere else for everything else. Discussed starting Nuvaring on the last day of her next period. All questions answered.

## 2023-06-22 NOTE — Progress Notes (Signed)
 Duplicate encounter. See other note for full details.   Tempie Fee, MD 06/22/23  8:58 AM

## 2023-06-30 ENCOUNTER — Encounter: Payer: Self-pay | Admitting: Family Medicine

## 2023-08-11 NOTE — Addendum Note (Signed)
 Addended by: WONDA ROGUE on: 08/11/2023 09:28 AM   Modules accepted: Orders

## 2023-12-22 ENCOUNTER — Ambulatory Visit

## 2023-12-22 ENCOUNTER — Encounter: Payer: Self-pay | Admitting: Family Medicine

## 2023-12-22 VITALS — BP 110/68 | HR 83 | Ht 62.0 in | Wt 180.2 lb

## 2023-12-22 DIAGNOSIS — Z113 Encounter for screening for infections with a predominantly sexual mode of transmission: Secondary | ICD-10-CM

## 2023-12-22 DIAGNOSIS — Z3009 Encounter for other general counseling and advice on contraception: Secondary | ICD-10-CM

## 2023-12-22 DIAGNOSIS — Z3201 Encounter for pregnancy test, result positive: Secondary | ICD-10-CM | POA: Diagnosis not present

## 2023-12-22 DIAGNOSIS — Z309 Encounter for contraceptive management, unspecified: Secondary | ICD-10-CM | POA: Diagnosis not present

## 2023-12-22 LAB — HM HIV SCREENING LAB: HM HIV Screening: NEGATIVE

## 2023-12-22 LAB — PREGNANCY, URINE: Preg Test, Ur: POSITIVE — AB

## 2023-12-22 LAB — WET PREP FOR TRICH, YEAST, CLUE
Clue Cell Exam: NEGATIVE
Trichomonas Exam: NEGATIVE
Yeast Exam: NEGATIVE

## 2023-12-22 MED ORDER — ETONOGESTREL 68 MG ~~LOC~~ IMPL: 68.0000 mg | DRUG_IMPLANT | Freq: Once | SUBCUTANEOUS | Status: DC

## 2023-12-22 NOTE — Progress Notes (Signed)
 " [facility] [address] [place] [address] Main phone: [phone]  Family Planning Visit - Repeat Yearly Visit  Subjective:  Sheri Brooks is a 28 y.o. G2P2002  being seen today for an annual wellness visit and to discuss contraception options. The patient is currently using no method - no contraceptive precautions for pregnancy prevention. Patient does not want a pregnancy in the next year.   Patient reports they are looking for a method with the following characteristics:  High efficacy at preventing pregnancy  Patient has the following medical problems:  Patient Active Problem List   Diagnosis Date Noted   History of depression 04/18/2013   Chief Complaint  Patient presents with   Annual Exam    PE/Nexplanon  insertion    HPI Patient reports to clinic for nexplanon  insertion. Initially states she hasn't had sex in 2 months, and is currently menstruating but strongly desires a pregnancy test. PT came back positive. Pt then reports she had a medical AB about 1 month ago at [facility] [place]. Reports after the AB, she had a negative PT. Reports that she last had sex 2 days ago.  Patient denies other concerns about self    Review of Systems  Constitutional:  Negative for weight loss.  Eyes:  Negative for blurred vision.  Respiratory:  Negative for cough and shortness of breath.   Cardiovascular:  Negative for claudication.  Gastrointestinal:  Negative for nausea.  Genitourinary:  Negative for dysuria and frequency.  Skin:  Negative for rash.  Neurological:  Negative for headaches.  Endo/Heme/Allergies:  Does not bruise/bleed easily.    See flowsheet for further details and programmatic requirements Hyperlink available at the top of the signed note in blue.  Flow sheet content below:  Pregnancy Intention Screening Does the patient want to become pregnant in the next year?: No Does  the patient's partner want to become pregnant in the next year?: No Would the patient like to discuss contraceptive options today?: Yes Other:  Password: 0000 Sexual History What age did you start your period?: 13 How often do you have your period?: monthly Date of last sex?: 10/23/23 Has the patient had unprotected sex within the last 5 days?: No Do you have sex with men, women, both men and women?: Men only In the past 2 months how many partners have you had sex with?: 1 In the past 12 months, how many partners have you had sex with?: 1 Is it possible that any of your sex partners in the past 12 months had sex with someone else whild they were still in a sexual relationship with you?: No What ways do you have sex?: Vaginal Do you or your partner use condoms and/or dental dams every time you have vaginal, oral or anal sex?: Sometimes Do you douche?: No Date of last HIV test?: 06/20/23 Have you ever had an STD?: Yes Have any of your partners had an STD?: Yes Partner Previous STD?: Chlamydia Date?:  (10 yeras ago) Have you or your partner ever shot up drugs?: No Have any of your partners used drugs in the past?: No Have you or your partners exchanged money or drugs for sex?: No  Diabetes screening This patient is 28 y.o. with a BMI of Body mass index is 32.96 kg/m.SABRA  Is patient eligible for diabetes screening (age >35 and BMI >25)?  no  Was Hgb A1c ordered? not applicable  STI screening Patient reports 1  of partners in last year.  Does this patient desire STI screening?  Yes  Hepatitis C screening Has patient been screened once for HCV in the past?  No  No results found for: HCVAB  Does the patient meet criteria for HCV testing? No  (If yes-- Screen for HCV through [facility]) Criteria:  Since the last HCV result, does the patient have any of the following? - Current drug use - Have a partner with drug use - Has been incarcerated  Hepatitis B screening Does the  patient meet criteria for HBV testing? No Criteria:  -Household, sexual or needle sharing contact with HBV -History of drug use -HIV positive -Those with known Hep C  Cervical Cancer Screening  Result Date Procedure Results Follow-ups  03/23/2022 IGP, rfx Aptima HPV ASCU DIAGNOSIS:: Comment Specimen adequacy:: Comment Clinician Provided ICD10: Comment Performed by:: Comment QC reviewed by:: Comment PAP Smear Comment: . Note:: Comment Test Methodology: Comment PAP Reflex: Comment   05/15/2017 Pap IG, rfx HPV ASCU,16/18 DIAGNOSIS:: Comment Specimen adequacy:: Comment Clinician Provided ICD10: Comment Performed by:: Comment PAP Smear Comment: . Note:: Comment Test Methodology: Comment PAP Reflex: Comment     Health Maintenance Due  Topic Date Due   Hepatitis C Screening  Never done   Pneumococcal Vaccine (1 of 2 - PCV) Never done   Hepatitis B Vaccines 19-59 Average Risk (1 of 3 - 19+ 3-dose series) Never done   HPV VACCINES (1 - 3-dose SCDM series) Never done   Influenza Vaccine  Never done   COVID-19 Vaccine (1 - 2025-26 season) Never done    The following portions of the patient's history were reviewed and updated as appropriate: allergies, current medications, past family history, past medical history, past social history, past surgical history and problem list. Problem list updated.  Objective:   Vitals:   12/22/23 0902  BP: 110/68  Pulse: 83  Weight: 180 lb 3.2 oz (81.7 kg)  Height: 5' 2 (1.575 m)    Physical Exam Constitutional:      Appearance: Normal appearance.  HENT:     Head: Normocephalic and atraumatic.  Pulmonary:     Effort: Pulmonary effort is normal.  Abdominal:     Palpations: Abdomen is soft.  Musculoskeletal:        General: Normal range of motion.  Skin:    General: Skin is warm and dry.  Neurological:     General: No focal deficit present.     Mental Status: She is alert.  Psychiatric:        Mood and Affect: Mood normal.         Behavior: Behavior normal.     Assessment and Plan:  Sheri Brooks is a 28 y.o. female G2P2002 presenting to the [facility] for an yearly wellness and contraception visit  1. Family planning (Primary) Contraception counseling:  Reviewed options based on patient desire and reproductive life plan. Patient is interested in Hormonal Injection. This was not provided to the patient today. PT positive today. Reviewed that with hx of medical AB 1 month ago, and previous negative PT, possible that she is acutally pregnant again vs retained products of conception. Denies sick symptoms today. Reviewed strong recommendation of calling [facility] for appointment. Needs ultrasound to determine if products retained that need further management. Pt agrees to call. Counseled to RTC at a later date if she still wants nexplanon .   Risks, benefits, and typical effectiveness rates were reviewed.  Questions were  answered.  Written information was also given to the patient to review.    The patient will follow up in  3 weeks for surveillance.  The patient was told to call with any further questions, or with any concerns about this method of contraception.  Emphasized use of condoms 100% of the time for STI prevention.  Emergency Contraception Precautions (ECP): Patient assessed for need of ECP. She is not a candidate based on report of unprotected sex within past 120 hours (5 days). Took a plan B yesterday. Educated on ECP and reviewed options.  Patient desires no method - patient politely declines any emergency contraception.   - Pregnancy, urine  2. Screening for venereal disease  - Chlamydia/Gonorrhea [facility] - HIV [place] [facility] - Syphilis Serology, [place] [facility] - WET PREP FOR TRICH, YEAST, CLUE   No follow-ups on file.  No future appointments.  Sheri Brooks, [place] "

## 2023-12-25 ENCOUNTER — Ambulatory Visit: Payer: Self-pay | Admitting: Family Medicine
